# Patient Record
Sex: Female | Born: 1945 | Race: White | Hispanic: No | Marital: Married | Smoking: Former smoker
Health system: Southern US, Community
[De-identification: ages and names within clinical notes are randomized; demographics above are authoritative.]

## PROBLEM LIST (undated history)

## (undated) DIAGNOSIS — M84376A Stress fracture, unspecified foot, initial encounter for fracture: Secondary | ICD-10-CM

## (undated) DIAGNOSIS — F32A Depression, unspecified: Secondary | ICD-10-CM

## (undated) DIAGNOSIS — G4700 Insomnia, unspecified: Secondary | ICD-10-CM

## (undated) DIAGNOSIS — Z9889 Other specified postprocedural states: Secondary | ICD-10-CM

## (undated) DIAGNOSIS — J302 Other seasonal allergic rhinitis: Secondary | ICD-10-CM

## (undated) DIAGNOSIS — K219 Gastro-esophageal reflux disease without esophagitis: Secondary | ICD-10-CM

## (undated) DIAGNOSIS — R112 Nausea with vomiting, unspecified: Secondary | ICD-10-CM

## (undated) DIAGNOSIS — M8580 Other specified disorders of bone density and structure, unspecified site: Secondary | ICD-10-CM

## (undated) DIAGNOSIS — F329 Major depressive disorder, single episode, unspecified: Secondary | ICD-10-CM

## (undated) HISTORY — DX: Major depressive disorder, single episode, unspecified: F32.9

## (undated) HISTORY — PX: ABDOMINAL HYSTERECTOMY: SHX81

## (undated) HISTORY — PX: BACK SURGERY: SHX140

## (undated) HISTORY — PX: OOPHORECTOMY: SHX86

## (undated) HISTORY — DX: Depression, unspecified: F32.A

## (undated) HISTORY — DX: Other specified disorders of bone density and structure, unspecified site: M85.80

## (undated) HISTORY — PX: GANGLION CYST EXCISION: SHX1691

## (undated) HISTORY — DX: Insomnia, unspecified: G47.00

## (undated) HISTORY — DX: Stress fracture, unspecified foot, initial encounter for fracture: M84.376A

## (undated) HISTORY — PX: TONSILLECTOMY: SUR1361

## (undated) HISTORY — DX: Gastro-esophageal reflux disease without esophagitis: K21.9

---

## 1998-03-10 ENCOUNTER — Other Ambulatory Visit: Admission: RE | Admit: 1998-03-10 | Discharge: 1998-03-10 | Payer: Self-pay | Admitting: Obstetrics and Gynecology

## 1999-03-31 ENCOUNTER — Other Ambulatory Visit: Admission: RE | Admit: 1999-03-31 | Discharge: 1999-03-31 | Payer: Self-pay | Admitting: Obstetrics and Gynecology

## 2000-03-20 ENCOUNTER — Other Ambulatory Visit: Admission: RE | Admit: 2000-03-20 | Discharge: 2000-03-20 | Payer: Self-pay | Admitting: Obstetrics and Gynecology

## 2000-03-30 ENCOUNTER — Encounter: Payer: Self-pay | Admitting: Rheumatology

## 2000-03-30 ENCOUNTER — Encounter: Admission: RE | Admit: 2000-03-30 | Discharge: 2000-03-30 | Payer: Self-pay | Admitting: Rheumatology

## 2001-02-15 ENCOUNTER — Other Ambulatory Visit: Admission: RE | Admit: 2001-02-15 | Discharge: 2001-02-15 | Payer: Self-pay | Admitting: Obstetrics and Gynecology

## 2002-02-19 ENCOUNTER — Other Ambulatory Visit: Admission: RE | Admit: 2002-02-19 | Discharge: 2002-02-19 | Payer: Self-pay | Admitting: Obstetrics and Gynecology

## 2003-02-28 ENCOUNTER — Other Ambulatory Visit: Admission: RE | Admit: 2003-02-28 | Discharge: 2003-02-28 | Payer: Self-pay | Admitting: Obstetrics and Gynecology

## 2004-03-02 ENCOUNTER — Other Ambulatory Visit: Admission: RE | Admit: 2004-03-02 | Discharge: 2004-03-02 | Payer: Self-pay | Admitting: Obstetrics and Gynecology

## 2005-03-15 ENCOUNTER — Other Ambulatory Visit: Admission: RE | Admit: 2005-03-15 | Discharge: 2005-03-15 | Payer: Self-pay | Admitting: Obstetrics and Gynecology

## 2006-03-28 ENCOUNTER — Other Ambulatory Visit: Admission: RE | Admit: 2006-03-28 | Discharge: 2006-03-28 | Payer: Self-pay | Admitting: Obstetrics and Gynecology

## 2007-01-24 ENCOUNTER — Emergency Department (HOSPITAL_COMMUNITY): Admission: EM | Admit: 2007-01-24 | Discharge: 2007-01-24 | Payer: Self-pay | Admitting: Emergency Medicine

## 2007-04-03 ENCOUNTER — Other Ambulatory Visit: Admission: RE | Admit: 2007-04-03 | Discharge: 2007-04-03 | Payer: Self-pay | Admitting: Obstetrics and Gynecology

## 2007-11-09 ENCOUNTER — Encounter: Admission: RE | Admit: 2007-11-09 | Discharge: 2007-11-09 | Payer: Self-pay | Admitting: Sports Medicine

## 2007-11-28 ENCOUNTER — Encounter: Admission: RE | Admit: 2007-11-28 | Discharge: 2007-11-28 | Payer: Self-pay | Admitting: Sports Medicine

## 2008-04-25 ENCOUNTER — Ambulatory Visit: Payer: Self-pay | Admitting: Sports Medicine

## 2008-04-25 DIAGNOSIS — M5430 Sciatica, unspecified side: Secondary | ICD-10-CM | POA: Insufficient documentation

## 2008-05-13 ENCOUNTER — Ambulatory Visit: Payer: Self-pay | Admitting: Family Medicine

## 2008-05-14 ENCOUNTER — Telehealth (INDEPENDENT_AMBULATORY_CARE_PROVIDER_SITE_OTHER): Payer: Self-pay | Admitting: Family Medicine

## 2008-05-14 ENCOUNTER — Telehealth: Payer: Self-pay | Admitting: *Deleted

## 2008-05-19 ENCOUNTER — Encounter: Payer: Self-pay | Admitting: Family Medicine

## 2008-05-22 ENCOUNTER — Telehealth: Payer: Self-pay | Admitting: *Deleted

## 2008-05-26 ENCOUNTER — Encounter: Payer: Self-pay | Admitting: Family Medicine

## 2008-05-26 ENCOUNTER — Telehealth: Payer: Self-pay | Admitting: *Deleted

## 2008-05-27 ENCOUNTER — Telehealth: Payer: Self-pay | Admitting: *Deleted

## 2008-06-11 ENCOUNTER — Encounter: Admission: RE | Admit: 2008-06-11 | Discharge: 2008-06-11 | Payer: Self-pay | Admitting: Neurosurgery

## 2008-06-23 ENCOUNTER — Telehealth: Payer: Self-pay | Admitting: Family Medicine

## 2008-06-27 ENCOUNTER — Encounter: Admission: RE | Admit: 2008-06-27 | Discharge: 2008-06-27 | Payer: Self-pay | Admitting: Neurosurgery

## 2008-10-13 ENCOUNTER — Ambulatory Visit (HOSPITAL_COMMUNITY): Admission: RE | Admit: 2008-10-13 | Discharge: 2008-10-14 | Payer: Self-pay | Admitting: Neurosurgery

## 2010-01-07 ENCOUNTER — Ambulatory Visit (HOSPITAL_COMMUNITY): Admission: RE | Admit: 2010-01-07 | Discharge: 2010-01-07 | Payer: Self-pay | Admitting: Gastroenterology

## 2011-04-12 NOTE — Op Note (Signed)
NAMECAMIELLE, Sandra Hall                ACCOUNT NO.:  [id number]   MEDICAL RECORD NO.:  [id number]          PATIENT TYPE:  AMB   LOCATION:  SDS                          FACILITY:  [facility]   PHYSICIAN:  Donalee Citrin, M.D.        DATE OF BIRTH:  October 29, 1946   DATE OF PROCEDURE:  10/13/2008  DATE OF DISCHARGE:                               OPERATIVE REPORT   PREOPERATIVE DIAGNOSIS:  Left L4 radiculopathy from anterior disk bulge  with foraminal stenosis and extraforaminally on the left at L4-L5.   PROCEDURE:  Extraforaminal approach to an L4 foraminotomy with  microscopic dissection of the left L4 nerve root and microscopic  diskectomy.   SURGEON:  Donalee Citrin, MD   ASSISTANT:  Kathaleen Maser. Pool, MD   ANESTHESIA:  General endotracheal.   HISTORY OF PRESENT ILLNESS:  The patient is a very pleasant 65 year old  female who has had progressive worsening back, left buttock, and  posterior thigh pain that has been refractory to all forms of  conservative treatment with epidural steroid injections, physical  therapy, cortisone injections, and did respond to transforaminal  injections at L4.  Risks and benefits of the operation were explained to  the patient who understood and agreed to proceed forward.   PROCEDURE IN DETAIL:  The patient was brought to the OR, was induced  general anesthesia, positioned prone on the Wilson frame.  Back was  prepped and prepped in the usual sterile fashion.  The fascia was  localized at the appropriate level.  A midline incision was made with  Bovie electrocautery which was used to take down the incision.  Subperiosteal dissection was carried out at the lamina of L4.  TP of L4  was exposed.  Intraoperative x-ray confirmed the position at the  appropriate level in the inferior aspect of facet complex at L3-L4  lateral pars.  The superior aspect of facet complex at L4-L5 was drilled  down.  Then the undersurface of the lateral pars was underbitten with a  2-mm Kerrison  punch.  The operating microscope was draped and brought  into field.  Under microscopic illumination, the anterior transverse  ligament was identified and removed in piecemeal fashion exposing the L4  nerve root.  Then the L4 nerve root was teased off the bulging disk with  stenosis in the inferior aspect of L4 root.  This was incised.  Annulotomy was made.  Pituitary rongeurs were used to clean off the  bulging disk.  A large osteophyte was bitten off the inferior aspect of  the L4 vertebral body decompressing the L4 foramen.  At the end  diskectomy, there was no further stenosis in the extraforaminal space.  The coronary dilator and hockey stick were easily passed along the L4  foramen.  There was no further disk.  The coronary dilator was also used  to pass centrally and inferomedially.  After adequate decompression had  been achieved, the wound was copiously irrigated.  Meticulous hemostasis  was maintained.  Gelfoam was overlaid atop the L4 nerve root.  The wound  was  closed in layers  with interrupted Vicryl and running 4-0 subcuticular.  Benzoin and Steri-Strips were applied.  The patient was taken to the  recovery room in stable condition.  At the end of the case, needle,  sponge, and instrument count was correct.           ______________________________  Donalee Citrin, M.D.     GC/MEDQ  D:  10/13/2008  T:  10/13/2008  Job:  [id number]

## 2011-04-15 NOTE — Consult Note (Signed)
NAMEYARED, Sandra NO.:  [id number]   MEDICAL RECORD NO.:  [id number]          PATIENT TYPE:  EMS   LOCATION:  ED                           FACILITY:  [facility]   PHYSICIAN:  Lyn Records, M.D.   DATE OF BIRTH:  [birth year]   DATE OF CONSULTATION:  01/24/2007  DATE OF DISCHARGE:                                 CONSULTATION   REASON FOR CONSULTATION:  Syncope.   This consultation is performed in the emergency room at [facility].   CONCLUSION:  1. Vasovagal syncope with head injury.  2. Recurrent nausea and vomiting and probable volume contraction      starting 24 hours ago.  3. Hypokalemia secondary to #2.  4. Diffuse T wave flattening secondary to hypokalemia.      a.     Normal Cardiolite study approximately 2 years ago.      b.     Normal cardiac markers.   RECOMMENDATIONS:  1. IV fluid repletion.  2. Replete potassium.  3. Treat nausea symptomatically.  4. Consider observation for head injury in this patient who was      seemingly knocked unconscious when she had syncope.  5. I do not feel any specific cardiac evaluation is indicated under      these circumstances.   COMMENTS:  The patient is a very pleasant 65 year old with no prior  history of heart disease. She had a Cardiolite study within the last 2  years that was negative for evidence of ischemia. This study was  performed by Dr. Armanda Magic.   The patient has had no cardiopulmonary complaints. Starting yesterday,  she developed nausea with vomiting. This occurred repeatedly until last  evening when she called Dr. Katina Degree office and spoke with Dr. Earl Gala  who was on call. He ordered Phenergan. She took 1 Phenergan last night,  tried to sleep but was unable to sleep. This morning she got up and went  into the kitchen to try to see if she could eat something and again  immediately began having nausea, became sweaty and the next thing she  knew she awakened on the floor with  injury to her head with bleeding and  also injury to her neck. She was brought to the emergency room where she  was evaluated by Dr. Oletta Lamas. An EKG was performed that showed diffuse T  wave flattening and the concern about myocardial ischemia was raised. In  speaking with the patient, there has been absolutely no cardiopulmonary  complaints at all either preceding or following the event.   MEDICATIONS:  The patient's usual home medications are Ambien, Effexor  and Premarin.   ALLERGIES:  None known.   HABITS:  She does not smoke or drink alcohol to excess.   PHYSICAL EXAMINATION:  GENERAL:  The patient is in no acute distress.  VITAL SIGNS:  Her blood pressure is 124/72, heart rate is 86,  respiratory rate 16, orthostatic pressures are not measured.  HEENT:  She now has a neck brace on, she has staples in the occipital  region of her head.  She states her neck and shoulders are still sore.  The back of her head is sore.  CARDIAC:  Normal.  ABDOMEN:  Soft.  EXTREMITIES:  No edema.   EKG reveals nonspecific T wave flattening that is diffuse and likely  related to hypokalemia. No acute ST-T wave changes or definite evidence  of ischemia is noted. The patient's CK-MB is 466/9.0 with a normal  relative index of 1.9. Two troponin levels are normal at 0.01. The  patient's potassium is 3.4.   DISCUSSION:  I do not believe the patient has had any type of a cardiac  event. There is certainly no evidence of an acute coronary syndrome or  that this was a cardiogenic episode of syncope. She had prodrome, had  had preceding vomiting and it is likely that of the syncope was related  to vasovagal mechanism plus/minus the component of volume contraction.  No further cardiac evaluation seems necessary.      Lyn Records, M.D.  Electronically Signed     HWS/MEDQ  D:  01/24/2007  T:  01/24/2007  Job:  [id number]   cc:   Demetria Pore. Coral Spikes, M.D.  Fax: [phone]

## 2011-05-04 ENCOUNTER — Other Ambulatory Visit: Payer: Self-pay | Admitting: Obstetrics and Gynecology

## 2011-08-30 LAB — CBC
HCT: 41.2
MCHC: 33.7
RBC: 4.25
RDW: 12.6
WBC: 7.8

## 2013-01-10 ENCOUNTER — Ambulatory Visit: Payer: Self-pay | Admitting: Gynecology

## 2013-01-22 ENCOUNTER — Ambulatory Visit (INDEPENDENT_AMBULATORY_CARE_PROVIDER_SITE_OTHER): Payer: Medicare Other | Admitting: Gynecology

## 2013-01-22 ENCOUNTER — Encounter: Payer: Self-pay | Admitting: Gynecology

## 2013-01-22 VITALS — BP 112/66 | Ht 64.0 in | Wt 124.0 lb

## 2013-01-22 DIAGNOSIS — N649 Disorder of breast, unspecified: Secondary | ICD-10-CM

## 2013-01-22 DIAGNOSIS — R222 Localized swelling, mass and lump, trunk: Secondary | ICD-10-CM

## 2013-01-22 DIAGNOSIS — L988 Other specified disorders of the skin and subcutaneous tissue: Secondary | ICD-10-CM

## 2013-01-22 NOTE — Progress Notes (Signed)
Patient ID: Sandra Hall, female   DOB: 05-02-1946, 67 y.o.   MRN: [id number] Patient presents having not been seen in the practice for a number of years complaining of a one-month history of left chest mass.  Noticed on self-exam. Is not painful. Also points to a separate area on the skin that she noticed. No nipple discharge. Receives mammograms annually and states that she is coming due in June. She's also made an appointment to see me in June for her annual follow up exam.  Exam with Selena Batten assistant Neck supple without cervical adenopathy or supraclavicular adenopathy. Thyroid normal.  Both breast examined lying and sitting without masses retractions discharge adenopathy. The area she's pointing to is on her anterior left chest wall subclavicular, out of the breast tissue range and I feel no palpable abnormalities over the muscle/chest wall.  The other area she's pointing to on the left breast is a small cherry angioma benign in appearance.  Physical Exam  Pulmonary/Chest:      Assessment and plan: 1. Small cherry angioma left breast benign in appearance. Patient will continue to monitor. 2. Area of concern anterior upper chest wall. No visible or palpable abnormalities. I reassured the patient and at this point I asked her to observe the area. If it would change all her certainly enlarge she knows to let me know if his. Next up would be ultrasound over this area. She is planning mammogram in June and will follow up with me for reexamination.

## 2013-01-22 NOTE — Patient Instructions (Signed)
Follow up with me if there's any change in the area you are feeling. Otherwise follow up in June for annual checkup. Schedule your mammogram in June as planned

## 2013-05-15 ENCOUNTER — Ambulatory Visit (INDEPENDENT_AMBULATORY_CARE_PROVIDER_SITE_OTHER): Payer: Self-pay | Admitting: Gynecology

## 2013-05-15 ENCOUNTER — Encounter: Payer: Self-pay | Admitting: Gynecology

## 2013-05-15 VITALS — BP 120/76 | Ht 64.0 in | Wt 124.0 lb

## 2013-05-15 DIAGNOSIS — N951 Menopausal and female climacteric states: Secondary | ICD-10-CM

## 2013-05-15 DIAGNOSIS — N952 Postmenopausal atrophic vaginitis: Secondary | ICD-10-CM

## 2013-05-15 DIAGNOSIS — Z780 Asymptomatic menopausal state: Secondary | ICD-10-CM

## 2013-05-15 NOTE — Progress Notes (Signed)
Sandra Hall 06-Dec-1945 [id number]        67 y.o.  G1P0101 for followup exam.  Several issues noted below.  Past medical history,surgical history, medications, allergies, family history and social history were all reviewed and documented in the EPIC chart.  ROS:  Performed and pertinent positives and negatives are included in the history, assessment and plan .  Exam: Kim assistant Filed Vitals:   05/15/13 1059  BP: 120/76  Height: 5\' 4"  (1.626 m)  Weight: 124 lb (56.246 kg)   General appearance  Normal Skin grossly normal Head/Neck normal with no cervical or supraclavicular adenopathy thyroid normal Lungs  clear Cardiac RR, without RMG Abdominal  soft, nontender, without masses, organomegaly or hernia Breasts  examined lying and sitting without masses, retractions, discharge or axillary adenopathy. Pelvic  Ext/BUS/vagina  normal with atrophic changes  Adnexa  Without masses or tenderness    Anus and perineum  normal   Rectovaginal  normal sphincter tone without palpated masses or tenderness.    Assessment/Plan:  67 y.o. G97P0101 female for followup exam, status post abdominal hysterectomy/RSO.   1. Menopausal symptoms. Patient is having some hot flashes and sweats. Had previously been on HRT but stopped it. Options reviewed patient is comfortable just following now and does not want to try any medication. I did review possible OTC soy based products. 2. Atrophic vaginal changes. Asymptomatic. We'll continue to monitor. 3. Pap smear 2012. No Pap smear done today. No history of abnormal Pap smears previously. Reviewed current screening guidelines. She is status post hysterectomy for benign indications and age of 63. Stop screening reviewed versus less frequent screening interval. We'll readdress on an annual basis. 4. DEXA 5 years ago. Recommend repeat now. Patient will schedule. Increase calcium vitamin D reviewed.  5. Mammography 06/2012. Continue with annual mammography. SBE  monthly reviewed. 6. Colonoscopy 8 years ago. Repeat a 10 year interval per their recommendation. 7. Health maintenance. No lab work done as this is done through her primary physician's office. Followup for her DEXA otherwise one year.    Dara Lords MD, 11:34 AM 05/15/2013

## 2013-05-15 NOTE — Patient Instructions (Signed)
Schedule bone density. Followup for annual exam in one year

## 2013-05-30 ENCOUNTER — Encounter: Payer: Self-pay | Admitting: Gynecology

## 2013-05-30 ENCOUNTER — Ambulatory Visit (INDEPENDENT_AMBULATORY_CARE_PROVIDER_SITE_OTHER): Payer: Medicare Other

## 2013-05-30 DIAGNOSIS — M949 Disorder of cartilage, unspecified: Secondary | ICD-10-CM

## 2013-05-30 DIAGNOSIS — M899 Disorder of bone, unspecified: Secondary | ICD-10-CM

## 2013-05-30 DIAGNOSIS — N952 Postmenopausal atrophic vaginitis: Secondary | ICD-10-CM

## 2013-05-30 DIAGNOSIS — M8580 Other specified disorders of bone density and structure, unspecified site: Secondary | ICD-10-CM

## 2013-06-19 ENCOUNTER — Other Ambulatory Visit: Payer: Self-pay | Admitting: *Deleted

## 2013-06-19 DIAGNOSIS — M8580 Other specified disorders of bone density and structure, unspecified site: Secondary | ICD-10-CM

## 2013-06-24 ENCOUNTER — Other Ambulatory Visit: Payer: Medicare Other

## 2013-06-24 DIAGNOSIS — M8580 Other specified disorders of bone density and structure, unspecified site: Secondary | ICD-10-CM

## 2013-07-01 ENCOUNTER — Telehealth: Payer: Self-pay

## 2013-07-01 NOTE — Telephone Encounter (Signed)
Patient said she has not heard from her Bone Density or Vit D results.

## 2013-07-01 NOTE — Telephone Encounter (Signed)
Left message to call.

## 2013-07-01 NOTE — Telephone Encounter (Signed)
Vitamin D is normal but it also is available in my chart and I would remind her that she needs to look their first before calling for lab results. She should have received a copy of our DEXA report sheet. May check with Sherrilyn Rist to see why not if the patient says she never got it. Regardless it does show some osteopenia but not to the extent that I would recommend using medication like Fosamax. I would recommend weightbearing exercise, total calcium intake of 1200 mg daily and rechecking her bone density in 2 years.

## 2013-07-03 NOTE — Telephone Encounter (Signed)
Patient advised.  I sent her Activation code and website info for My Chart. I told her Sandra Hall will send her a Dexa Report Sheet in the mail and if she has questions to let me know.

## 2013-07-15 ENCOUNTER — Encounter: Payer: Self-pay | Admitting: Gynecology

## 2014-05-19 ENCOUNTER — Ambulatory Visit (INDEPENDENT_AMBULATORY_CARE_PROVIDER_SITE_OTHER): Payer: Commercial Managed Care - HMO | Admitting: Gynecology

## 2014-05-19 ENCOUNTER — Encounter: Payer: Self-pay | Admitting: Gynecology

## 2014-05-19 VITALS — BP 120/76 | Ht 64.0 in | Wt 124.0 lb

## 2014-05-19 DIAGNOSIS — N951 Menopausal and female climacteric states: Secondary | ICD-10-CM

## 2014-05-19 DIAGNOSIS — N952 Postmenopausal atrophic vaginitis: Secondary | ICD-10-CM

## 2014-05-19 NOTE — Patient Instructions (Signed)
Followup in one year for annual exam, sooner if any issues.  You may obtain a copy of any labs that were done today by logging onto MyChart as outlined in the instructions provided with your AVS (after visit summary). The office will not call with normal lab results but certainly if there are any significant abnormalities then we will contact you.   Health Maintenance, Female A healthy lifestyle and preventative care can promote health and wellness.  Maintain regular health, dental, and eye exams.  Eat a healthy diet. Foods like vegetables, fruits, whole grains, low-fat dairy products, and lean protein foods contain the nutrients you need without too many calories. Decrease your intake of foods high in solid fats, added sugars, and salt. Get information about a proper diet from your caregiver, if necessary.  Regular physical exercise is one of the most important things you can do for your health. Most adults should get at least 150 minutes of moderate-intensity exercise (any activity that increases your heart rate and causes you to sweat) each week. In addition, most adults need muscle-strengthening exercises on 2 or more days a week.   Maintain a healthy weight. The body mass index (BMI) is a screening tool to identify possible weight problems. It provides an estimate of body fat based on height and weight. Your caregiver can help determine your BMI, and can help you achieve or maintain a healthy weight. For adults 20 years and older:  A BMI below 18.5 is considered underweight.  A BMI of 18.5 to 24.9 is normal.  A BMI of 25 to 29.9 is considered overweight.  A BMI of 30 and above is considered obese.  Maintain normal blood lipids and cholesterol by exercising and minimizing your intake of saturated fat. Eat a balanced diet with plenty of fruits and vegetables. Blood tests for lipids and cholesterol should begin at age 20 and be repeated every 5 years. If your lipid or cholesterol levels are  high, you are over 50, or you are a high risk for heart disease, you may need your cholesterol levels checked more frequently.Ongoing high lipid and cholesterol levels should be treated with medicines if diet and exercise are not effective.  If you smoke, find out from your caregiver how to quit. If you do not use tobacco, do not start.  Lung cancer screening is recommended for adults aged 55 80 years who are at high risk for developing lung cancer because of a history of smoking. Yearly low-dose computed tomography (CT) is recommended for people who have at least a 30-pack-year history of smoking and are a current smoker or have quit within the past 15 years. A pack year of smoking is smoking an average of 1 pack of cigarettes a day for 1 year (for example: 1 pack a day for 30 years or 2 packs a day for 15 years). Yearly screening should continue until the smoker has stopped smoking for at least 15 years. Yearly screening should also be stopped for people who develop a health problem that would prevent them from having lung cancer treatment.  If you are pregnant, do not drink alcohol. If you are breastfeeding, be very cautious about drinking alcohol. If you are not pregnant and choose to drink alcohol, do not exceed 1 drink per day. One drink is considered to be 12 ounces (355 mL) of beer, 5 ounces (148 mL) of wine, or 1.5 ounces (44 mL) of liquor.  Avoid use of street drugs. Do not share needles with   anyone. Ask for help if you need support or instructions about stopping the use of drugs.  High blood pressure causes heart disease and increases the risk of stroke. Blood pressure should be checked at least every 1 to 2 years. Ongoing high blood pressure should be treated with medicines, if weight loss and exercise are not effective.  If you are 55 to 67 years old, ask your caregiver if you should take aspirin to prevent strokes.  Diabetes screening involves taking a blood sample to check your fasting  blood sugar level. This should be done once every 3 years, after age 45, if you are within normal weight and without risk factors for diabetes. Testing should be considered at a younger age or be carried out more frequently if you are overweight and have at least 1 risk factor for diabetes.  Breast cancer screening is essential preventative care for women. You should practice "breast self-awareness." This means understanding the normal appearance and feel of your breasts and may include breast self-examination. Any changes detected, no matter how small, should be reported to a caregiver. Women in their 20s and 30s should have a clinical breast exam (CBE) by a caregiver as part of a regular health exam every 1 to 3 years. After age 40, women should have a CBE every year. Starting at age 40, women should consider having a mammogram (breast X-ray) every year. Women who have a family history of breast cancer should talk to their caregiver about genetic screening. Women at a high risk of breast cancer should talk to their caregiver about having an MRI and a mammogram every year.  Breast cancer gene (BRCA)-related cancer risk assessment is recommended for women who have family members with BRCA-related cancers. BRCA-related cancers include breast, ovarian, tubal, and peritoneal cancers. Having family members with these cancers may be associated with an increased risk for harmful changes (mutations) in the breast cancer genes BRCA1 and BRCA2. Results of the assessment will determine the need for genetic counseling and BRCA1 and BRCA2 testing.  The Pap test is a screening test for cervical cancer. Women should have a Pap test starting at age 21. Between ages 21 and 29, Pap tests should be repeated every 2 years. Beginning at age 30, you should have a Pap test every 3 years as long as the past 3 Pap tests have been normal. If you had a hysterectomy for a problem that was not cancer or a condition that could lead to  cancer, then you no longer need Pap tests. If you are between ages 65 and 70, and you have had normal Pap tests going back 10 years, you no longer need Pap tests. If you have had past treatment for cervical cancer or a condition that could lead to cancer, you need Pap tests and screening for cancer for at least 20 years after your treatment. If Pap tests have been discontinued, risk factors (such as a new sexual partner) need to be reassessed to determine if screening should be resumed. Some women have medical problems that increase the chance of getting cervical cancer. In these cases, your caregiver may recommend more frequent screening and Pap tests.  The human papillomavirus (HPV) test is an additional test that may be used for cervical cancer screening. The HPV test looks for the virus that can cause the cell changes on the cervix. The cells collected during the Pap test can be tested for HPV. The HPV test could be used to screen women aged 30   years and older, and should be used in women of any age who have unclear Pap test results. After the age of 30, women should have HPV testing at the same frequency as a Pap test.  Colorectal cancer can be detected and often prevented. Most routine colorectal cancer screening begins at the age of 50 and continues through age 75. However, your caregiver may recommend screening at an earlier age if you have risk factors for colon cancer. On a yearly basis, your caregiver may provide home test kits to check for hidden blood in the stool. Use of a small camera at the end of a tube, to directly examine the colon (sigmoidoscopy or colonoscopy), can detect the earliest forms of colorectal cancer. Talk to your caregiver about this at age 50, when routine screening begins. Direct examination of the colon should be repeated every 5 to 10 years through age 75, unless early forms of pre-cancerous polyps or small growths are found.  Hepatitis C blood testing is recommended for  all people born from 1945 through 1965 and any individual with known risks for hepatitis C.  Practice safe sex. Use condoms and avoid high-risk sexual practices to reduce the spread of sexually transmitted infections (STIs). Sexually active women aged 25 and younger should be checked for Chlamydia, which is a common sexually transmitted infection. Older women with new or multiple partners should also be tested for Chlamydia. Testing for other STIs is recommended if you are sexually active and at increased risk.  Osteoporosis is a disease in which the bones lose minerals and strength with aging. This can result in serious bone fractures. The risk of osteoporosis can be identified using a bone density scan. Women ages 65 and over and women at risk for fractures or osteoporosis should discuss screening with their caregivers. Ask your caregiver whether you should be taking a calcium supplement or vitamin D to reduce the rate of osteoporosis.  Menopause can be associated with physical symptoms and risks. Hormone replacement therapy is available to decrease symptoms and risks. You should talk to your caregiver about whether hormone replacement therapy is right for you.  Use sunscreen. Apply sunscreen liberally and repeatedly throughout the day. You should seek shade when your shadow is shorter than you. Protect yourself by wearing long sleeves, pants, a wide-brimmed hat, and sunglasses year round, whenever you are outdoors.  Notify your caregiver of new moles or changes in moles, especially if there is a change in shape or color. Also notify your caregiver if a mole is larger than the size of a pencil eraser.  Stay current with your immunizations. Document Released: 05/30/2011 Document Revised: 03/11/2013 Document Reviewed: 05/30/2011 [employer] Patient Information 2014 [employer].   

## 2014-05-19 NOTE — Progress Notes (Signed)
Sandra Hall 08/10/1946 [id number]        68 y.o.  G1P0101 for annual exam. Several issues noted below. Past medical history,surgical history, problem list, medications, allergies, family history and social history were all reviewed and documented as reviewed in the EPIC chart.  ROS:  12 system ROS performed with pertinent positives and negatives included in the history, assessment and plan.  Included Systems: General, HEENT, Neck, Cardiovascular, Pulmonary, Gastrointestinal, Genitourinary, Musculoskeletal, Dermatologic, Endocrine, Hematological, Neurologic, Psychiatric Additional significant findings :  None   Exam: Kim assistant Filed Vitals:   05/19/14 1053  BP: 120/76  Height: 5\' 4"  (1.626 m)  Weight: 124 lb (56.246 kg)   General appearance:  Normal affect, orientation and appearance. Skin: Grossly normal HEENT: Without gross lesions.  No cervical or supraclavicular adenopathy. Thyroid normal.  Lungs:  Clear without wheezing, rales or rhonchi Cardiac: RR, without RMG Abdominal:  Soft, nontender, without masses, guarding, rebound, organomegaly or hernia Breasts:  Examined lying and sitting without masses, retractions, discharge or axillary adenopathy. Pelvic:  Ext/BUS/vagina with generalized atrophic changes   Adnexa  Without masses or tenderness    Anus and perineum  Normal   Rectovaginal  Normal sphincter tone without palpated masses or tenderness.    Assessment/Plan:  68 y.o. G60P0101 female for followup exam.   1. Postmenopausal/atrophic genital changes. Patient continues to have hot flushes and sweats. Had been on ERT in the past but discontinued. Is not interested in prescription medication at this time. I again reviewed OTC products include soy-based. Patient is going to try this. Currently is not sexually active. Is not having issues with vaginal dryness. Will followup if this becomes an issue. 2. Osteopenia. DEXA 05/2013 T score -1.9. FRAX 17%/1.3%. Vitamin D level last  year 58. Increased calcium/vitamin D reviewed. Plan repeat DEXA next year at two-year interval. 3. Pap smear 2012. No Pap smear done today. Over the age of 65. No history of abnormal Pap smears previously. Status post hysterectomy for benign indications. Per current screening guidelines we both agree to stop screening and she is comfortable with this. 4. Colonoscopy 2006. Plan repeat next year and she will arrange this. 5. Mammography 06/2013. Continue with annual mammography. SBE monthly reviewed. 6. Health maintenance. No routine blood work done as this is done through her primary physician's office. Followup one year, sooner as needed.   Note: This document was prepared with digital dictation and possible smart phrase technology. Any transcriptional errors that result from this process are unintentional.   Anastasio Auerbach MD, 11:38 AM 05/19/2014

## 2014-07-21 ENCOUNTER — Encounter: Payer: Self-pay | Admitting: Gynecology

## 2014-09-29 ENCOUNTER — Encounter: Payer: Self-pay | Admitting: Gynecology

## 2014-12-05 DIAGNOSIS — M84374D Stress fracture, right foot, subsequent encounter for fracture with routine healing: Secondary | ICD-10-CM | POA: Diagnosis not present

## 2014-12-05 DIAGNOSIS — M722 Plantar fascial fibromatosis: Secondary | ICD-10-CM | POA: Diagnosis not present

## 2014-12-31 DIAGNOSIS — M84374D Stress fracture, right foot, subsequent encounter for fracture with routine healing: Secondary | ICD-10-CM | POA: Diagnosis not present

## 2015-02-02 DIAGNOSIS — M8580 Other specified disorders of bone density and structure, unspecified site: Secondary | ICD-10-CM | POA: Insufficient documentation

## 2015-02-02 DIAGNOSIS — Z87891 Personal history of nicotine dependence: Secondary | ICD-10-CM | POA: Diagnosis not present

## 2015-02-02 DIAGNOSIS — R21 Rash and other nonspecific skin eruption: Secondary | ICD-10-CM | POA: Diagnosis present

## 2015-02-02 DIAGNOSIS — K219 Gastro-esophageal reflux disease without esophagitis: Secondary | ICD-10-CM | POA: Insufficient documentation

## 2015-02-02 DIAGNOSIS — G4700 Insomnia, unspecified: Secondary | ICD-10-CM | POA: Insufficient documentation

## 2015-02-02 DIAGNOSIS — L253 Unspecified contact dermatitis due to other chemical products: Secondary | ICD-10-CM | POA: Insufficient documentation

## 2015-02-03 ENCOUNTER — Emergency Department (HOSPITAL_BASED_OUTPATIENT_CLINIC_OR_DEPARTMENT_OTHER)
Admission: EM | Admit: 2015-02-03 | Discharge: 2015-02-03 | Disposition: A | Discharge status: Home / Self Care | Payer: Commercial Managed Care - HMO | Attending: Emergency Medicine | Admitting: Emergency Medicine

## 2015-02-03 ENCOUNTER — Encounter (HOSPITAL_BASED_OUTPATIENT_CLINIC_OR_DEPARTMENT_OTHER): Payer: Self-pay | Admitting: Emergency Medicine

## 2015-02-03 DIAGNOSIS — G4700 Insomnia, unspecified: Secondary | ICD-10-CM | POA: Diagnosis not present

## 2015-02-03 DIAGNOSIS — L253 Unspecified contact dermatitis due to other chemical products: Secondary | ICD-10-CM | POA: Diagnosis not present

## 2015-02-03 DIAGNOSIS — K219 Gastro-esophageal reflux disease without esophagitis: Secondary | ICD-10-CM | POA: Diagnosis not present

## 2015-02-03 DIAGNOSIS — M8580 Other specified disorders of bone density and structure, unspecified site: Secondary | ICD-10-CM | POA: Diagnosis not present

## 2015-02-03 DIAGNOSIS — Z87891 Personal history of nicotine dependence: Secondary | ICD-10-CM | POA: Diagnosis not present

## 2015-02-03 MED ORDER — DEXAMETHASONE SODIUM PHOSPHATE 10 MG/ML IJ SOLN
10.0000 mg | Freq: Once | INTRAMUSCULAR | Status: AC
  Administered 2015-02-03: 10 mg via INTRAMUSCULAR
  Filled 2015-02-03: qty 1

## 2015-02-03 MED ORDER — HYDROXYZINE HCL 25 MG PO TABS: 25.0000 mg | ORAL_TABLET | Freq: Four times a day (QID) | ORAL | Status: DC | PRN

## 2015-02-03 NOTE — ED Provider Notes (Signed)
CSN: [id number]     Arrival date & time 02/02/15  2358 History   First MD Initiated Contact with Patient 02/03/15 [phone]     Chief Complaint  Patient presents with  . Allergic Reaction     (Consider location/radiation/quality/duration/timing/severity/associated sxs/prior Treatment) HPI  This is a 69 year old female who applied a new brand of sunscreen to her face neck trunk and upper arms the morning of the day before yesterday. Yesterday morning she developed a rash on the areas to which she applied the sunscreen. The rash is erythematous, pruritic and confluent on the face and neck. She had a mild sensation of tongue swelling. She has had no shortness of breath. She has had no nausea, vomiting or diarrhea. She took 25 milligrams of Benadryl yesterday morning and another 25 milligrams yesterday evening with minimal relief. She gave away the bottle of sunscreen so is unaware what ingredients were in it.  Past Medical History  Diagnosis Date  . Acid reflux   . Insomnia   . Osteopenia 05/2013    T score -1.9 FRAX 17%/1.3%   Past Surgical History  Procedure Laterality Date  . Back surgery    . Oophorectomy      RSO  . Ganglion cyst excision    . Tonsillectomy    . Abdominal hysterectomy      Hyst and RSO for endometriosis   Family History  Problem Relation Age of Onset  . Diabetes Mother   . Cancer Father     Mesothelioma  . Diabetes Sister   . Ovarian cancer Maternal Aunt   . Cancer Paternal Uncle     Lung  . Diabetes Maternal Grandmother    History  Substance Use Topics  . Smoking status: Former Research scientist (life sciences)  . Smokeless tobacco: Not on file  . Alcohol Use: 0.5 oz/week    1 drink(s) per week   OB History    Gravida Para Term Preterm AB TAB SAB Ectopic Multiple Living   1 1  1      1      Review of Systems  All other systems reviewed and are negative.   Allergies  Codeine  Home Medications   Prior to Admission medications   Medication Sig Start Date End Date Taking?  Authorizing Provider  Calcium Carbonate-Vitamin D (CALCIUM + D PO) Take by mouth.    Historical Provider, MD  Cholecalciferol (VITAMIN D PO) Take by mouth.    Historical Provider, MD  esomeprazole (NEXIUM) 40 MG capsule Take 40 mg by mouth daily before breakfast.    Historical Provider, MD  Eszopiclone 3 MG TABS Take 3 mg by mouth at bedtime. Take immediately before bedtime    Historical Provider, MD  Fexofenadine HCl (ALLEGRA PO) Take by mouth.    Historical Provider, MD  Multiple Vitamin (MULTIVITAMIN) tablet Take 1 tablet by mouth daily.    Historical Provider, MD   BP 101/72 mmHg  Pulse 80  Temp(Src) 98.6 F (37 C) (Oral)  Resp 18  Ht 5\' 4"  (1.626 m)  Wt 124 lb (56.246 kg)  BMI 21.27 kg/m2  SpO2 100%   Physical Exam  General: Well-developed, well-nourished female in no acute distress; appearance consistent with age of record HENT: normocephalic; atraumatic; pharynx normal; no edema; no dysphonia Eyes: pupils equal, round and reactive to light; extraocular muscles intact Neck: supple Heart: regular rate and rhythm Lungs: clear to auscultation bilaterally Abdomen: soft; nondistended; nontender; bowel sounds present Extremities: No deformity; full range of motion; pulses normal Neurologic: Awake,  alert and oriented; motor function intact in all extremities and symmetric; no facial droop Skin: Warm and dry; fine maculopapular rash of upper arms, trunk, neck and face. Rash becomes confluent on the neck and face Psychiatric: Normal mood and affect    ED Course  Procedures (including critical care time)   MDM    Shanon Rosser, MD 02/03/15 [phone]

## 2015-02-03 NOTE — Discharge Instructions (Signed)

## 2015-02-03 NOTE — ED Notes (Signed)
Pt states she took one benadryl this morning and one this evening around 6pm.

## 2015-02-03 NOTE — ED Notes (Signed)
Pt states that she started having an allergic reaction to sun screen this morning.

## 2015-02-05 DIAGNOSIS — L259 Unspecified contact dermatitis, unspecified cause: Secondary | ICD-10-CM | POA: Diagnosis not present

## 2015-02-12 DIAGNOSIS — L239 Allergic contact dermatitis, unspecified cause: Secondary | ICD-10-CM | POA: Diagnosis not present

## 2015-02-12 DIAGNOSIS — L578 Other skin changes due to chronic exposure to nonionizing radiation: Secondary | ICD-10-CM | POA: Diagnosis not present

## 2015-02-12 DIAGNOSIS — L820 Inflamed seborrheic keratosis: Secondary | ICD-10-CM | POA: Diagnosis not present

## 2015-02-12 DIAGNOSIS — L819 Disorder of pigmentation, unspecified: Secondary | ICD-10-CM | POA: Diagnosis not present

## 2015-02-12 DIAGNOSIS — L814 Other melanin hyperpigmentation: Secondary | ICD-10-CM | POA: Diagnosis not present

## 2015-04-01 DIAGNOSIS — H40013 Open angle with borderline findings, low risk, bilateral: Secondary | ICD-10-CM | POA: Diagnosis not present

## 2015-05-06 DIAGNOSIS — G4700 Insomnia, unspecified: Secondary | ICD-10-CM | POA: Diagnosis not present

## 2015-05-06 DIAGNOSIS — Z0000 Encounter for general adult medical examination without abnormal findings: Secondary | ICD-10-CM | POA: Diagnosis not present

## 2015-05-06 DIAGNOSIS — Z1211 Encounter for screening for malignant neoplasm of colon: Secondary | ICD-10-CM | POA: Diagnosis not present

## 2015-05-06 DIAGNOSIS — M8588 Other specified disorders of bone density and structure, other site: Secondary | ICD-10-CM | POA: Diagnosis not present

## 2015-05-06 DIAGNOSIS — J309 Allergic rhinitis, unspecified: Secondary | ICD-10-CM | POA: Diagnosis not present

## 2015-05-06 DIAGNOSIS — E780 Pure hypercholesterolemia: Secondary | ICD-10-CM | POA: Diagnosis not present

## 2015-05-06 DIAGNOSIS — K219 Gastro-esophageal reflux disease without esophagitis: Secondary | ICD-10-CM | POA: Diagnosis not present

## 2015-05-06 DIAGNOSIS — R7309 Other abnormal glucose: Secondary | ICD-10-CM | POA: Diagnosis not present

## 2015-05-27 ENCOUNTER — Encounter: Payer: Self-pay | Admitting: Gynecology

## 2015-05-27 ENCOUNTER — Ambulatory Visit (INDEPENDENT_AMBULATORY_CARE_PROVIDER_SITE_OTHER): Payer: Commercial Managed Care - HMO | Admitting: Gynecology

## 2015-05-27 VITALS — BP 120/76 | Ht 64.0 in | Wt 125.0 lb

## 2015-05-27 DIAGNOSIS — N952 Postmenopausal atrophic vaginitis: Secondary | ICD-10-CM

## 2015-05-27 DIAGNOSIS — M8580 Other specified disorders of bone density and structure, unspecified site: Secondary | ICD-10-CM | POA: Diagnosis not present

## 2015-05-27 DIAGNOSIS — Z01419 Encounter for gynecological examination (general) (routine) without abnormal findings: Secondary | ICD-10-CM

## 2015-05-27 NOTE — Progress Notes (Signed)
Sandra Hall [birth year] [id number]        69 y.o.  G1P0101 for breast and pelvic exam.  Several issues noted below.  Past medical history,surgical history, problem list, medications, allergies, family history and social history were all reviewed and documented as reviewed in the EPIC chart.  ROS:  Performed with pertinent positives and negatives included in the history, assessment and plan.   Additional significant findings :  none   Exam: Kim Counsellor Vitals:   05/27/15 1047  BP: 120/76  Height: 5\' 4"  (1.626 m)  Weight: 125 lb (56.7 kg)   General appearance:  Normal affect, orientation and appearance. Skin: Grossly normal HEENT: Without gross lesions.  No cervical or supraclavicular adenopathy. Thyroid normal.  Lungs:  Clear without wheezing, rales or rhonchi Cardiac: RR, without RMG Abdominal:  Soft, nontender, without masses, guarding, rebound, organomegaly or hernia Breasts:  Examined lying and sitting without masses, retractions, discharge or axillary adenopathy. Pelvic:  Ext/BUS/vagina with atrophic changes  Adnexa  Without masses or tenderness    Anus and perineum  Normal   Rectovaginal  Normal sphincter tone without palpated masses or tenderness.    Assessment/Plan:  69 y.o. G9P0101 female for breast and pelvic exam.   1. Post menopausal/atrophic genital changes. Status post TAH RSO for endometriosis.  Patient started Cherene Julian last year and has done well as far as decreased hot flashes and sweats. Not having issue with vaginal dryness.  Continue to monitor and report any issues. 2. Osteopenia. DEXA 05/2013 T score -1.9 FRAX 17%/1.3%. Repeat DEXA now a 2 year interval. Increased calcium vitamin D reviewed. 3. Pap smear 2012. No Pap smear done today. No history of abnormal Pap smears previously. Discussed current screening guidelines. She is over the age of 75 and status post hysterectomy for benign indications. We both agree to stop screening. 4. Mammography 06/2014.  Continue with annual mammography. SBE monthly reviewed. 5. Colonoscopy 2006. Schedule repeat colonoscopy this year. 6. Health maintenance. No routine blood work done as this is done at her primary physician's office. Follow up 1 year, sooner as needed.   Anastasio Auerbach MD, 11:10 AM 05/27/2015

## 2015-05-27 NOTE — Patient Instructions (Addendum)
Schedule your colonoscopy as it has been 10 years since your last one..  You may obtain a copy of any labs that were done today by logging onto MyChart as outlined in the instructions provided with your AVS (after visit summary). The office will not call with normal lab results but certainly if there are any significant abnormalities then we will contact you.   Health Maintenance, Female A healthy lifestyle and preventative care can promote health and wellness.  Maintain regular health, dental, and eye exams.  Eat a healthy diet. Foods like vegetables, fruits, whole grains, low-fat dairy products, and lean protein foods contain the nutrients you need without too many calories. Decrease your intake of foods high in solid fats, added sugars, and salt. Get information about a proper diet from your caregiver, if necessary.  Regular physical exercise is one of the most important things you can do for your health. Most adults should get at least 150 minutes of moderate-intensity exercise (any activity that increases your heart rate and causes you to sweat) each week. In addition, most adults need muscle-strengthening exercises on 2 or more days a week.   Maintain a healthy weight. The body mass index (BMI) is a screening tool to identify possible weight problems. It provides an estimate of body fat based on height and weight. Your caregiver can help determine your BMI, and can help you achieve or maintain a healthy weight. For adults 20 years and older:  A BMI below 18.5 is considered underweight.  A BMI of 18.5 to 24.9 is normal.  A BMI of 25 to 29.9 is considered overweight.  A BMI of 30 and above is considered obese.  Maintain normal blood lipids and cholesterol by exercising and minimizing your intake of saturated fat. Eat a balanced diet with plenty of fruits and vegetables. Blood tests for lipids and cholesterol should begin at age 67 and be repeated every 5 years. If your lipid or  cholesterol levels are high, you are over 50, or you are a high risk for heart disease, you may need your cholesterol levels checked more frequently.Ongoing high lipid and cholesterol levels should be treated with medicines if diet and exercise are not effective.  If you smoke, find out from your caregiver how to quit. If you do not use tobacco, do not start.  Lung cancer screening is recommended for adults aged 85 80 years who are at high risk for developing lung cancer because of a history of smoking. Yearly low-dose computed tomography (CT) is recommended for people who have at least a 30-pack-year history of smoking and are a current smoker or have quit within the past 15 years. A pack year of smoking is smoking an average of 1 pack of cigarettes a day for 1 year (for example: 1 pack a day for 30 years or 2 packs a day for 15 years). Yearly screening should continue until the smoker has stopped smoking for at least 15 years. Yearly screening should also be stopped for people who develop a health problem that would prevent them from having lung cancer treatment.  If you are pregnant, do not drink alcohol. If you are breastfeeding, be very cautious about drinking alcohol. If you are not pregnant and choose to drink alcohol, do not exceed 1 drink per day. One drink is considered to be 12 ounces (355 mL) of beer, 5 ounces (148 mL) of wine, or 1.5 ounces (44 mL) of liquor.  Avoid use of street drugs. Do not share  with anyone. Ask for help if you need support or instructions about stopping the use of drugs.  High blood pressure causes heart disease and increases the risk of stroke. Blood pressure should be checked at least every 1 to 2 years. Ongoing high blood pressure should be treated with medicines, if weight loss and exercise are not effective.  If you are 55 to 68 years old, ask your caregiver if you should take aspirin to prevent strokes.  Diabetes screening involves taking a blood sample  to check your fasting blood sugar level. This should be done once every 3 years, after age 45, if you are within normal weight and without risk factors for diabetes. Testing should be considered at a younger age or be carried out more frequently if you are overweight and have at least 1 risk factor for diabetes.  Breast cancer screening is essential preventative care for women. You should practice "breast self-awareness." This means understanding the normal appearance and feel of your breasts and may include breast self-examination. Any changes detected, no matter how small, should be reported to a caregiver. Women in their 20s and 30s should have a clinical breast exam (CBE) by a caregiver as part of a regular health exam every 1 to 3 years. After age 40, women should have a CBE every year. Starting at age 40, women should consider having a mammogram (breast X-ray) every year. Women who have a family history of breast cancer should talk to their caregiver about genetic screening. Women at a high risk of breast cancer should talk to their caregiver about having an MRI and a mammogram every year.  Breast cancer gene (BRCA)-related cancer risk assessment is recommended for women who have family members with BRCA-related cancers. BRCA-related cancers include breast, ovarian, tubal, and peritoneal cancers. Having family members with these cancers may be associated with an increased risk for harmful changes (mutations) in the breast cancer genes BRCA1 and BRCA2. Results of the assessment will determine the need for genetic counseling and BRCA1 and BRCA2 testing.  The Pap test is a screening test for cervical cancer. Women should have a Pap test starting at age 21. Between ages 21 and 29, Pap tests should be repeated every 2 years. Beginning at age 30, you should have a Pap test every 3 years as long as the past 3 Pap tests have been normal. If you had a hysterectomy for a problem that was not cancer or a condition  that could lead to cancer, then you no longer need Pap tests. If you are between ages 65 and 70, and you have had normal Pap tests going back 10 years, you no longer need Pap tests. If you have had past treatment for cervical cancer or a condition that could lead to cancer, you need Pap tests and screening for cancer for at least 20 years after your treatment. If Pap tests have been discontinued, risk factors (such as a new sexual partner) need to be reassessed to determine if screening should be resumed. Some women have medical problems that increase the chance of getting cervical cancer. In these cases, your caregiver may recommend more frequent screening and Pap tests.  The human papillomavirus (HPV) test is an additional test that may be used for cervical cancer screening. The HPV test looks for the virus that can cause the cell changes on the cervix. The cells collected during the Pap test can be tested for HPV. The HPV test could be used to screen women aged   aged 55 years and older, and should be used in women of any age who have unclear Pap test results. After the age of 76, women should have HPV testing at the same frequency as a Pap test.  Colorectal cancer can be detected and often prevented. Most routine colorectal cancer screening begins at the age of 64 and continues through age 83. However, your caregiver may recommend screening at an earlier age if you have risk factors for colon cancer. On a yearly basis, your caregiver may provide home test kits to check for hidden blood in the stool. Use of a small camera at the end of a tube, to directly examine the colon (sigmoidoscopy or colonoscopy), can detect the earliest forms of colorectal cancer. Talk to your caregiver about this at age 49, when routine screening begins. Direct examination of the colon should be repeated every 5 to 10 years through age 53, unless early forms of pre-cancerous polyps or small growths are found.  Hepatitis C blood testing is  recommended for all people born from [birth year] through 1964 and any individual with known risks for hepatitis C.  Practice safe sex. Use condoms and avoid high-risk sexual practices to reduce the spread of sexually transmitted infections (STIs). Sexually active women aged 64 and younger should be checked for Chlamydia, which is a common sexually transmitted infection. Older women with new or multiple partners should also be tested for Chlamydia. Testing for other STIs is recommended if you are sexually active and at increased risk.  Osteoporosis is a disease in which the bones lose minerals and strength with aging. This can result in serious bone fractures. The risk of osteoporosis can be identified using a bone density scan. Women ages 15 and over and women at risk for fractures or osteoporosis should discuss screening with their caregivers. Ask your caregiver whether you should be taking a calcium supplement or vitamin D to reduce the rate of osteoporosis.  Menopause can be associated with physical symptoms and risks. Hormone replacement therapy is available to decrease symptoms and risks. You should talk to your caregiver about whether hormone replacement therapy is right for you.  Use sunscreen. Apply sunscreen liberally and repeatedly throughout the day. You should seek shade when your shadow is shorter than you. Protect yourself by wearing long sleeves, pants, a wide-brimmed hat, and sunglasses year round, whenever you are outdoors.  Notify your caregiver of new moles or changes in moles, especially if there is a change in shape or color. Also notify your caregiver if a mole is larger than the size of a pencil eraser.  Stay current with your immunizations. Document Released: 05/30/2011 Document Revised: 03/11/2013 Document Reviewed: 05/30/2011 [facility] Patient Information 2014 [place].

## 2015-05-28 LAB — URINALYSIS W MICROSCOPIC + REFLEX CULTURE
BACTERIA UA: NONE SEEN
BILIRUBIN URINE: NEGATIVE
CRYSTALS: NONE SEEN
Casts: NONE SEEN
GLUCOSE, UA: NEGATIVE mg/dL
Hgb urine dipstick: NEGATIVE
Ketones, ur: NEGATIVE mg/dL
LEUKOCYTES UA: NEGATIVE
NITRITE: NEGATIVE
PROTEIN: NEGATIVE mg/dL
Specific Gravity, Urine: 1.012 (ref 1.005–1.030)
Squamous Epithelial / LPF: NONE SEEN
Urobilinogen, UA: 0.2 mg/dL (ref 0.0–1.0)
pH: 5 (ref 5.0–8.0)

## 2015-05-29 DIAGNOSIS — M8580 Other specified disorders of bone density and structure, unspecified site: Secondary | ICD-10-CM

## 2015-05-29 HISTORY — DX: Other specified disorders of bone density and structure, unspecified site: M85.80

## 2015-06-02 ENCOUNTER — Other Ambulatory Visit: Payer: Self-pay | Admitting: Gastroenterology

## 2015-06-15 DIAGNOSIS — M545 Low back pain: Secondary | ICD-10-CM | POA: Diagnosis not present

## 2015-06-25 ENCOUNTER — Other Ambulatory Visit: Payer: Self-pay | Admitting: Gynecology

## 2015-06-25 ENCOUNTER — Ambulatory Visit (INDEPENDENT_AMBULATORY_CARE_PROVIDER_SITE_OTHER): Payer: Commercial Managed Care - HMO

## 2015-06-25 DIAGNOSIS — M8580 Other specified disorders of bone density and structure, unspecified site: Secondary | ICD-10-CM

## 2015-06-25 DIAGNOSIS — Z13820 Encounter for screening for osteoporosis: Secondary | ICD-10-CM

## 2015-06-26 ENCOUNTER — Encounter: Payer: Self-pay | Admitting: Gynecology

## 2015-06-26 ENCOUNTER — Other Ambulatory Visit: Payer: Commercial Managed Care - HMO

## 2015-06-26 ENCOUNTER — Telehealth: Payer: Self-pay | Admitting: Gynecology

## 2015-06-26 DIAGNOSIS — M8580 Other specified disorders of bone density and structure, unspecified site: Secondary | ICD-10-CM

## 2015-06-26 NOTE — Telephone Encounter (Signed)
Left message for pt to call.

## 2015-06-26 NOTE — Telephone Encounter (Signed)
Tell patient that her recent bone density does show an increased fracture risk and I would like to talk to her about treatment options. Recommend office visits to do so. Also recommend baseline vitamin D level now.

## 2015-06-26 NOTE — Telephone Encounter (Signed)
Pt informed with the below note, order placed, pt will come today at 3:30pm

## 2015-06-27 LAB — VITAMIN D 25 HYDROXY (VIT D DEFICIENCY, FRACTURES): Vit D, 25-Hydroxy: 81 ng/mL (ref 30–100)

## 2015-07-20 DIAGNOSIS — Z1231 Encounter for screening mammogram for malignant neoplasm of breast: Secondary | ICD-10-CM | POA: Diagnosis not present

## 2015-07-21 ENCOUNTER — Encounter: Payer: Self-pay | Admitting: Gynecology

## 2015-07-22 ENCOUNTER — Ambulatory Visit: Payer: Commercial Managed Care - HMO | Admitting: Gynecology

## 2015-07-31 ENCOUNTER — Encounter: Payer: Self-pay | Admitting: Gynecology

## 2015-07-31 ENCOUNTER — Ambulatory Visit (INDEPENDENT_AMBULATORY_CARE_PROVIDER_SITE_OTHER): Payer: Commercial Managed Care - HMO | Admitting: Gynecology

## 2015-07-31 VITALS — BP 116/70

## 2015-07-31 DIAGNOSIS — M8580 Other specified disorders of bone density and structure, unspecified site: Secondary | ICD-10-CM | POA: Diagnosis not present

## 2015-07-31 NOTE — Patient Instructions (Signed)
Follow up when you're due for your annual exam. 

## 2015-07-31 NOTE — Progress Notes (Signed)
Sandra Hall July 03, 1946 [id number]        69 y.o.  G1P0101 presents to discuss her most recent DEXA which shows a T score of -2.2 FRAX 19%/3.8%. Without statistically significant change from prior DEXA 2014.  Past medical history,surgical history, problem list, medications, allergies, family history and social history were all reviewed and documented in the EPIC chart.  Directed ROS with pertinent positives and negatives documented in the history of present illness/assessment and plan.  Exam: Filed Vitals:   07/31/15 1052  BP: 116/70   General appearance:  Normal   Assessment/Plan:  69 y.o. G1P0101 with stable bone density but increased FRAX for the hip exceeding the 3% recommended treatment level. I reviewed this with her. She is active with weightbearing exercise and takes adequate calcium. Recent vitamin D 81. Options for treatment now versus waiting and repeating her bone density in 2 years discussed. The risks benefits of treatment discussed to include the side effects of the medications. After lengthy discussion the patient prefers not to start treatment now to continue to be active with weightbearing exercise and then to repeat her bone density in 2 years recognizing the possibility of worsening osteopenia with increasing fracture risk.    Anastasio Auerbach MD, 11:37 AM 07/31/2015

## 2015-08-21 ENCOUNTER — Encounter (HOSPITAL_COMMUNITY): Payer: Self-pay | Admitting: *Deleted

## 2015-09-01 ENCOUNTER — Other Ambulatory Visit: Payer: Self-pay | Admitting: Gastroenterology

## 2015-09-06 NOTE — Anesthesia Preprocedure Evaluation (Addendum)
Anesthesia Evaluation  Patient identified by MRN, date of birth, ID band Patient awake    Reviewed: Allergy & Precautions, H&P , NPO status , Patient's Chart, lab work & pertinent test results  History of Anesthesia Complications (+) PONV  Airway Mallampati: II  TM Distance: >3 FB Neck ROM: Full    Dental no notable dental hx. (+) Teeth Intact, Dental Advisory Given   Pulmonary neg pulmonary ROS, former smoker,    Pulmonary exam normal breath sounds clear to auscultation       Cardiovascular negative cardio ROS   Rhythm:Regular Rate:Normal     Neuro/Psych negative neurological ROS  negative psych ROS   GI/Hepatic Neg liver ROS, GERD  Medicated and Controlled,  Endo/Other  negative endocrine ROS  Renal/GU negative Renal ROS  negative genitourinary   Musculoskeletal   Abdominal   Peds  Hematology negative hematology ROS (+)   Anesthesia Other Findings   Reproductive/Obstetrics negative OB ROS                            Anesthesia Physical Anesthesia Plan  ASA: II  Anesthesia Plan: MAC   Post-op Pain Management:    Induction: Intravenous  Airway Management Planned: Simple Face Mask  Additional Equipment:   Intra-op Plan:   Post-operative Plan:   Informed Consent: I have reviewed the patients History and Physical, chart, labs and discussed the procedure including the risks, benefits and alternatives for the proposed anesthesia with the patient or authorized representative who has indicated his/her understanding and acceptance.   Dental advisory given  Plan Discussed with: CRNA  Anesthesia Plan Comments:         Anesthesia Quick Evaluation

## 2015-09-07 ENCOUNTER — Ambulatory Visit (HOSPITAL_COMMUNITY): Payer: Commercial Managed Care - HMO | Admitting: Anesthesiology

## 2015-09-07 ENCOUNTER — Encounter (HOSPITAL_COMMUNITY)
Admission: RE | Disposition: A | Discharge status: Home / Self Care | Payer: Self-pay | Source: Ambulatory Visit | Attending: Gastroenterology

## 2015-09-07 ENCOUNTER — Encounter (HOSPITAL_COMMUNITY): Payer: Self-pay | Admitting: *Deleted

## 2015-09-07 ENCOUNTER — Ambulatory Visit (HOSPITAL_COMMUNITY)
Admission: RE | Admit: 2015-09-07 | Discharge: 2015-09-07 | Disposition: A | Discharge status: Home / Self Care | Payer: Commercial Managed Care - HMO | Source: Ambulatory Visit | Attending: Gastroenterology | Admitting: Gastroenterology

## 2015-09-07 DIAGNOSIS — K219 Gastro-esophageal reflux disease without esophagitis: Secondary | ICD-10-CM | POA: Insufficient documentation

## 2015-09-07 DIAGNOSIS — Z1211 Encounter for screening for malignant neoplasm of colon: Secondary | ICD-10-CM | POA: Diagnosis not present

## 2015-09-07 DIAGNOSIS — Z87891 Personal history of nicotine dependence: Secondary | ICD-10-CM | POA: Diagnosis not present

## 2015-09-07 DIAGNOSIS — D120 Benign neoplasm of cecum: Secondary | ICD-10-CM | POA: Diagnosis not present

## 2015-09-07 DIAGNOSIS — D122 Benign neoplasm of ascending colon: Secondary | ICD-10-CM | POA: Diagnosis not present

## 2015-09-07 DIAGNOSIS — K635 Polyp of colon: Secondary | ICD-10-CM | POA: Diagnosis not present

## 2015-09-07 HISTORY — PX: COLONOSCOPY WITH PROPOFOL: SHX5780

## 2015-09-07 HISTORY — DX: Other seasonal allergic rhinitis: J30.2

## 2015-09-07 HISTORY — DX: Nausea with vomiting, unspecified: R11.2

## 2015-09-07 HISTORY — DX: Other specified postprocedural states: Z98.890

## 2015-09-07 SURGERY — COLONOSCOPY WITH PROPOFOL
Anesthesia: Monitor Anesthesia Care

## 2015-09-07 MED ORDER — PROPOFOL 10 MG/ML IV BOLUS
INTRAVENOUS | Status: AC
  Filled 2015-09-07: qty 20

## 2015-09-07 MED ORDER — SODIUM CHLORIDE 0.9 % IV SOLN: INTRAVENOUS | Status: DC

## 2015-09-07 MED ORDER — ONDANSETRON HCL 4 MG/2ML IJ SOLN
INTRAMUSCULAR | Status: AC
  Filled 2015-09-07: qty 2

## 2015-09-07 MED ORDER — LACTATED RINGERS IV SOLN
INTRAVENOUS | Status: DC
  Administered 2015-09-07: 1000 mL via INTRAVENOUS

## 2015-09-07 MED ORDER — LIDOCAINE HCL (PF) 2 % IJ SOLN
INTRAMUSCULAR | Status: DC | PRN
  Administered 2015-09-07: 20 mg via INTRADERMAL

## 2015-09-07 MED ORDER — ONDANSETRON HCL 4 MG/2ML IJ SOLN
INTRAMUSCULAR | Status: DC | PRN
  Administered 2015-09-07: 4 mg via INTRAVENOUS

## 2015-09-07 MED ORDER — PROPOFOL 10 MG/ML IV BOLUS
INTRAVENOUS | Status: DC | PRN
  Administered 2015-09-07: 50 mg via INTRAVENOUS
  Administered 2015-09-07: 100 mg via INTRAVENOUS
  Administered 2015-09-07: 20 mg via INTRAVENOUS
  Administered 2015-09-07: 50 mg via INTRAVENOUS
  Administered 2015-09-07: 30 mg via INTRAVENOUS
  Administered 2015-09-07: 50 mg via INTRAVENOUS
  Administered 2015-09-07: 100 mg via INTRAVENOUS

## 2015-09-07 SURGICAL SUPPLY — 22 items

## 2015-09-07 NOTE — Anesthesia Postprocedure Evaluation (Signed)
  Anesthesia Post-op Note  Patient: Sandra Hall  Procedure(s) Performed: Procedure(s): COLONOSCOPY WITH PROPOFOL (N/A)  Patient Location: PACU  Anesthesia Type: MAC  Level of Consciousness: awake and alert   Airway and Oxygen Therapy: Patient Spontanous Breathing  Post-op Pain: Controlled  Post-op Assessment: Post-op Vital signs reviewed, Patient's Cardiovascular Status Stable and Respiratory Function Stable  Post-op Vital Signs: Reviewed  Filed Vitals:   09/07/15 1010  BP: 134/61  Pulse: 64  Temp:   Resp: 16    Complications: No apparent anesthesia complications

## 2015-09-07 NOTE — Op Note (Signed)
Procedure: Screening colonoscopy  Endoscopist: Earle Gell  Premedication: Propofol administered by anesthesia  Procedure: The patient was placed in the left lateral decubitus position. Anal inspection and digital rectal exam were normal. The Pentax pediatric colonoscope was introduced into the rectum and advanced to the cecum. A normal-appearing appendiceal orifice and ileocecal valve were identified. Colonic preparation for the exam today was good. Withdrawal time was 25 minutes  Rectum. Normal. Retroflexed view of the distal rectum was normal  Sigmoid colon and descending colon. Normal  Splenic flexure. Normal  Transverse colon. Normal  Hepatic flexure. Normal  Ascending colon. From the mid ascending colon. A 7 mm sessile serrated appearing polyp was lifted by saline injection and removed with the cold snare.  Cecum and ileocecal valve. From the distal cecum, a 10 mm sessile polyp was removed with the cold snare and cold biopsy forceps  Assessment: A small polyp was removed from the cecum and a small polyp was removed from the mid ascending colon with the cold snare. Otherwise normal screening colonoscopy.  Recommendation: I will review the polyp pathology to determine when a repeat colonoscopy should be performed

## 2015-09-07 NOTE — Transfer of Care (Signed)
Immediate Anesthesia Transfer of Care Note  Patient: Sandra Hall  Procedure(s) Performed: Procedure(s): COLONOSCOPY WITH PROPOFOL (N/A)  Patient Location: PACU  Anesthesia Type:MAC  Level of Consciousness:  sedated, patient cooperative and responds to stimulation  Airway & Oxygen Therapy:Patient Spontanous Breathing   Post-op Assessment:  Report given to PACU RN and Post -op Vital signs reviewed and stable  Post vital signs:  Reviewed and stable  Last Vitals:  Filed Vitals:   09/07/15 0736  BP: 142/66  Pulse: 71  Temp: 36.6 C  Resp: 17    Complications: No apparent anesthesia complications
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
## 2015-09-07 NOTE — Discharge Instructions (Signed)

## 2015-09-08 ENCOUNTER — Encounter (HOSPITAL_COMMUNITY): Payer: Self-pay | Admitting: Gastroenterology

## 2015-10-09 DIAGNOSIS — Z23 Encounter for immunization: Secondary | ICD-10-CM | POA: Diagnosis not present

## 2015-10-16 DIAGNOSIS — L239 Allergic contact dermatitis, unspecified cause: Secondary | ICD-10-CM | POA: Diagnosis not present

## 2015-11-04 DIAGNOSIS — H2513 Age-related nuclear cataract, bilateral: Secondary | ICD-10-CM | POA: Diagnosis not present

## 2015-11-04 DIAGNOSIS — H5200 Hypermetropia, unspecified eye: Secondary | ICD-10-CM | POA: Diagnosis not present

## 2015-11-04 DIAGNOSIS — H40013 Open angle with borderline findings, low risk, bilateral: Secondary | ICD-10-CM | POA: Diagnosis not present

## 2015-11-04 DIAGNOSIS — H25013 Cortical age-related cataract, bilateral: Secondary | ICD-10-CM | POA: Diagnosis not present

## 2015-12-29 DIAGNOSIS — L2389 Allergic contact dermatitis due to other agents: Secondary | ICD-10-CM | POA: Diagnosis not present

## 2016-05-12 DIAGNOSIS — J309 Allergic rhinitis, unspecified: Secondary | ICD-10-CM | POA: Diagnosis not present

## 2016-05-12 DIAGNOSIS — Z0000 Encounter for general adult medical examination without abnormal findings: Secondary | ICD-10-CM | POA: Diagnosis not present

## 2016-05-12 DIAGNOSIS — K219 Gastro-esophageal reflux disease without esophagitis: Secondary | ICD-10-CM | POA: Diagnosis not present

## 2016-05-12 DIAGNOSIS — Z1159 Encounter for screening for other viral diseases: Secondary | ICD-10-CM | POA: Diagnosis not present

## 2016-05-12 DIAGNOSIS — Z131 Encounter for screening for diabetes mellitus: Secondary | ICD-10-CM | POA: Diagnosis not present

## 2016-05-12 DIAGNOSIS — G4700 Insomnia, unspecified: Secondary | ICD-10-CM | POA: Diagnosis not present

## 2016-05-12 DIAGNOSIS — E7800 Pure hypercholesterolemia, unspecified: Secondary | ICD-10-CM | POA: Diagnosis not present

## 2016-05-27 ENCOUNTER — Encounter: Payer: Commercial Managed Care - HMO | Admitting: Gynecology

## 2016-05-30 ENCOUNTER — Ambulatory Visit (INDEPENDENT_AMBULATORY_CARE_PROVIDER_SITE_OTHER): Payer: Commercial Managed Care - HMO | Admitting: Gynecology

## 2016-05-30 ENCOUNTER — Encounter: Payer: Self-pay | Admitting: Gynecology

## 2016-05-30 VITALS — BP 126/80 | Ht 64.0 in | Wt 126.0 lb

## 2016-05-30 DIAGNOSIS — M8580 Other specified disorders of bone density and structure, unspecified site: Secondary | ICD-10-CM

## 2016-05-30 DIAGNOSIS — Z01419 Encounter for gynecological examination (general) (routine) without abnormal findings: Secondary | ICD-10-CM | POA: Diagnosis not present

## 2016-05-30 DIAGNOSIS — Z8041 Family history of malignant neoplasm of ovary: Secondary | ICD-10-CM | POA: Diagnosis not present

## 2016-05-30 DIAGNOSIS — N951 Menopausal and female climacteric states: Secondary | ICD-10-CM | POA: Diagnosis not present

## 2016-05-30 DIAGNOSIS — N952 Postmenopausal atrophic vaginitis: Secondary | ICD-10-CM | POA: Diagnosis not present

## 2016-05-30 NOTE — Progress Notes (Signed)
    Sandra Hall February 26, 1946 [id number]        70 y.o.  G1P0101  for breast and pelvic exam. Several issues noted below.  Past medical history,surgical history, problem list, medications, allergies, family history and social history were all reviewed and documented as reviewed in the EPIC chart.  ROS:  Performed with pertinent positives and negatives included in the history, assessment and plan.   Additional significant findings :  Family history of ovarian and breast cancer as discussed below.   Exam: Wandra Scot assistant Filed Vitals:   05/30/16 1046  BP: 126/80  Height: 5\' 4"  (1.626 m)  Weight: 126 lb (57.153 kg)   General appearance:  Normal affect, orientation and appearance. Skin: Grossly normal HEENT: Without gross lesions.  No cervical or supraclavicular adenopathy. Thyroid normal.  Lungs:  Clear without wheezing, rales or rhonchi Cardiac: RR, without RMG Abdominal:  Soft, nontender, without masses, guarding, rebound, organomegaly or hernia Breasts:  Examined lying and sitting without masses, retractions, discharge or axillary adenopathy. Pelvic:  Ext/BUS/va with atrophic changes  Adnexa without masses or tenderness    Anus and perineum normal   Rectovaginal normal sphincter tone without palpated masses or tenderness.    Assessment/Plan:  70 y.o. G1P0101 female fobreast and pelvic exam.  1. Postmenopausal/atrophic genital changes/menopausal symptoms.  Status post TVH RSO for endometriosis. Does have some hot flashes and sweats. Takes Iraan and has relief with this. No issues or vaginal dryness. It is not bad enough that she wants to consider HRT. 2. Family history of ovarian and breast cancer.  Maternal aunt with ovarian cancer, postmenopausal. Separate maternal aunt with breast cancer. This maternal aunt's daughter also with breast cancer.  reviewed possible genetic linkage. I asked her to discuss with her cousin the cell type diagnoses of her breast cancer and  whether she was screened genetically. I also recommended the patient make an appointment to see the genetic counselors at [facility] to see if they feel genetic testing is warranted as well as calculating her risk of breast cancer based on history. The various scenarios if positive to include prophylactic salpingo-oophorectomy of her remaining ovary and breast cancer surveillance or mastectomies discussed. Patient agrees with follow up and knows if she does not hear from the genetic counselors within 2 weeks to arrange the appointment she is to call my office.  3. Osteopenia.  DEXA 05/2015 T score -2.2 FRAX 19%/3.8%.  Vitamin D level 80 last year. Plan repeat DEXA next year at 2 year interval. 4. Mammography 06/2015. Continue with annual mammography when due. SBE monthly reviewed. 5. Colonoscopy 2016. Repeat at their recommended interval. 6. Pap smear 2012. No Pap smear done today. No history of significant abnormal Pap smears. We both agree to stop screening based on current recommendations based on age and history of hysterectomy for benign indications. 7. Health maintenance. No routine lab work done as this is reportedly done elsewhere. Follow up 1 year, sooner as needed.  Greater than 10 minutes of my time in excess of her breast and pelvic exam was spent in direct face to face counseling and coordination of care in regards to her problems of family history of rest cancer and ovarian cancer.   Anastasio Auerbach MD, 11:39 AM 05/30/2016

## 2016-05-30 NOTE — Patient Instructions (Signed)
Genetic counseling office will call you to arrange an appointment. Call my office if you do not hear from them within 2 weeks.  You may obtain a copy of any labs that were done today by logging onto MyChart as outlined in the instructions provided with your AVS (after visit summary). The office will not call with normal lab results but certainly if there are any significant abnormalities then we will contact you.   Health Maintenance Adopting a healthy lifestyle and getting preventive care can go a long way to promote health and wellness. Talk with your health care provider about what schedule of regular examinations is right for you. This is a good chance for you to check in with your provider about disease prevention and staying healthy. In between checkups, there are plenty of things you can do on your own. Experts have done a lot of research about which lifestyle changes and preventive measures are most likely to keep you healthy. Ask your health care provider for more information. WEIGHT AND DIET  Eat a healthy diet  Be sure to include plenty of vegetables, fruits, low-fat dairy products, and lean protein.  Do not eat a lot of foods high in solid fats, added sugars, or salt.  Get regular exercise. This is one of the most important things you can do for your health.  Most adults should exercise for at least 150 minutes each week. The exercise should increase your heart rate and make you sweat (moderate-intensity exercise).  Most adults should also do strengthening exercises at least twice a week. This is in addition to the moderate-intensity exercise.  Maintain a healthy weight  Body mass index (BMI) is a measurement that can be used to identify possible weight problems. It estimates body fat based on height and weight. Your health care provider can help determine your BMI and help you achieve or maintain a healthy weight.  For females 69 years of age and older:   A BMI below 18.5 is  considered underweight.  A BMI of 18.5 to 24.9 is normal.  A BMI of 25 to 29.9 is considered overweight.  A BMI of 30 and above is considered obese.  Watch levels of cholesterol and blood lipids  You should start having your blood tested at 69 years of age for lipids and cholesterol at 69 years of age, then have this test every 5 years.  You may need to have your cholesterol levels checked more often if:  Your lipid or cholesterol levels are high.  You are older than 69 years of age.  You are at high risk for heart disease.  CANCER SCREENING   Lung Cancer  Lung cancer screening is recommended for adults 54-82 years old who are at high risk for lung cancer because of a history of smoking.  Lung cancer screening is recommended for adults 69-82 years old who are at high risk for lung cancer because of a history of smoking.  A yearly low-dose CT scan of the lungs is recommended for people who:  Currently smoke.  Have quit within the past 15 years.  Have at least a 30-pack-year history of smoking. A pack year is smoking an average of one pack of cigarettes a day for 1 year.  Yearly screening should continue until it has been 15 years since you quit.  Yearly screening should stop if you develop a health problem that would prevent you from having lung cancer treatment.  Breast Cancer  Practice breast self-awareness. This means understanding how your breasts normally appear and feel.  It also means doing regular breast self-exams. Let your health care provider know about any changes, no matter how small.  If you are in your 20s or 30s, you should have a clinical breast exam (CBE) by a health care provider every 1-3 years as part of a regular health exam.  If you are 90 or older, If you are 69 or older, have a CBE every year. Also consider having a breast X-ray (mammogram) every year.  If you have a family history of breast cancer, talk to your health care provider about genetic screening.  If you are at high risk for breast cancer, talk to your health care provider about having an MRI and a mammogram every year.  Breast cancer gene (BRCA)  assessment is recommended for women who have family members with BRCA-related cancers. BRCA-related cancers include:  Breast.  Ovarian.  Tubal.  Peritoneal cancers.  Results of the assessment will determine the need for genetic counseling and BRCA1 and BRCA2 testing. Cervical Cancer Routine pelvic examinations to screen for cervical cancer are no longer recommended for nonpregnant women who are considered low risk for cancer of the pelvic organs (ovaries, uterus, and vagina) and who do not have symptoms. A pelvic examination may be necessary if you have symptoms including those associated with pelvic infections. Ask your health care provider if a screening pelvic exam is right for you.   The Pap test is the screening test for cervical cancer for women who are considered at risk.  If you had a hysterectomy for a problem that was not cancer or a condition that could lead to cancer, then you no longer need Pap tests.  If you are older than 65 years, and you have had normal Pap tests for the past 10 years, you no longer need to have Pap tests.  If you have had past treatment for cervical cancer or a condition that could lead to cancer, you need Pap tests and screening for cancer for at least 20 years after your treatment.  If you no longer get a Pap test, assess your risk factors if they change (such as having a new sexual partner). This can affect whether you should start being screened again.  Some women have medical problems that increase their chance of getting cervical cancer. If this is the case for you, your health care provider may recommend more frequent screening and Pap tests.  The human papillomavirus (HPV) test is another test that may be used for cervical cancer screening. The HPV test looks for the virus that can cause cell changes in the cervix. The cells collected during the Pap test can be tested for HPV.  The HPV test can be used to screen women 42 years of age and older.  Getting tested for HPV can extend the interval between normal Pap tests from three to five years.  An HPV test also should be used to screen women of any age who have unclear Pap test results.  After 69 years of age, women should have HPV testing as often as Pap tests.  Colorectal Cancer  This type of cancer can be detected and often prevented.  Routine colorectal cancer screening usually begins at 69 years of age and continues through 69 years of age.  Your health care provider may recommend screening at an earlier age if you have risk factors for colon cancer.  Your health care provider may also recommend using home test kits to check for hidden blood in the stool.  A small camera at the end of a tube can be used to examine your colon directly (sigmoidoscopy or colonoscopy). This is done to check for  the earliest forms of colorectal cancer.  Routine screening usually begins at age 71.  Direct examination of the colon should be repeated every 5-10 years through 69 years of age. However, you may need to be screened more often if early forms of precancerous polyps or small growths are found. Skin Cancer  Check your skin from head to toe regularly.  Tell your health care provider about any new moles or changes in moles, especially if there is a change in a mole's shape or color.  Also tell your health care provider if you have a mole that is larger than the size of a pencil eraser.  Always use sunscreen. Apply sunscreen liberally and repeatedly throughout the day.  Protect yourself by wearing long sleeves, pants, a wide-brimmed hat, and sunglasses whenever you are outside. HEART DISEASE, DIABETES, AND HIGH BLOOD PRESSURE   Have your blood pressure checked at least every 1-2 years. High blood pressure causes heart disease and increases the risk of stroke.  If you are between 84 years and 71 years old, ask your health care provider if you should take aspirin to prevent  strokes.  Have regular diabetes screenings. This involves taking a blood sample to check your fasting blood sugar level.  If you are at a normal weight and have a low risk for diabetes, have this test once every three years after 69 years of age.  If you are overweight and have a high risk for diabetes, consider being tested at a younger age or more often. PREVENTING INFECTION  Hepatitis B  If you have a higher risk for hepatitis B, you should be screened for this virus. You are considered at high risk for hepatitis B if:  You were born in a country where hepatitis B is common. Ask your health care provider which countries are considered high risk.  Your parents were born in a high-risk country, and you have not been immunized against hepatitis B (hepatitis B vaccine).  You have HIV or AIDS.  You use needles to inject street drugs.  You live with someone who has hepatitis B.  You have had sex with someone who has hepatitis B.  You get hemodialysis treatment.  You take certain medicines for conditions, including cancer, organ transplantation, and autoimmune conditions. Hepatitis C  Blood testing is recommended for:  Everyone born from [birth year] through 1964.  Anyone with known risk factors for hepatitis C. Sexually transmitted infections (STIs)  You should be screened for sexually transmitted infections (STIs) including gonorrhea and chlamydia if:  You are sexually active and are younger than 69 years of age.  You are older than 69 years of age and your health care provider tells you that you are at risk for this type of infection.  Your sexual activity has changed since you were last screened and you are at an increased risk for chlamydia or gonorrhea. Ask your health care provider if you are at risk.  If you do not have HIV, but are at risk, it may be recommended that you take a prescription medicine daily to prevent HIV infection. This is called pre-exposure prophylaxis  (PrEP). You are considered at risk if:  You are sexually active and do not regularly use condoms or know the HIV status of your partner(s).  You take drugs by injection.  You are sexually active with a partner who has HIV. Talk with your health care provider about whether you are at high risk of being infected with HIV. If you  choose to begin PrEP, you should first be tested for HIV. You should then be tested every 3 months for as long as you are taking PrEP.  PREGNANCY   If you are premenopausal and you may become pregnant, ask your health care provider about preconception counseling.  If you may become pregnant, take 400 to 800 micrograms (mcg) of folic acid every day.  If you want to prevent pregnancy, talk to your health care provider about birth control (contraception). OSTEOPOROSIS AND MENOPAUSE   Osteoporosis is a disease in which the bones lose minerals and strength with aging. This can result in serious bone fractures. Your risk for osteoporosis can be identified using a bone density scan.  If you are 31 years of age or older, or if you are at risk for osteoporosis and fractures, ask your health care provider if you should be screened.  Ask your health care provider whether you should take a calcium or vitamin D supplement to lower your risk for osteoporosis.  Menopause may have certain physical symptoms and risks.  Hormone replacement therapy may reduce some of these symptoms and risks. Talk to your health care provider about whether hormone replacement therapy is right for you.  HOME CARE INSTRUCTIONS   Schedule regular health, dental, and eye exams.  Stay current with your immunizations.   Do not use any tobacco products including cigarettes, chewing tobacco, or electronic cigarettes.  If you are pregnant, do not drink alcohol.  If you are breastfeeding, limit how much and how often you drink alcohol.  Limit alcohol intake to no more than 1 drink per day for  nonpregnant women. One drink equals 12 ounces of beer, 5 ounces of wine, or 1 ounces of hard liquor.  Do not use street drugs.  Do not share needles.  Ask your health care provider for help if you need support or information about quitting drugs.  Tell your health care provider if you often feel depressed.  Tell your health care provider if you have ever been abused or do not feel safe at home. Document Released: 05/30/2011 Document Revised: 03/31/2014 Document Reviewed: 10/16/2013 [facility] Patient Information 2015 [place], [place]. This information is not intended to replace advice given to you by your health care provider. Make sure you discuss any questions you have with your health care provider.

## 2016-06-08 DIAGNOSIS — M549 Dorsalgia, unspecified: Secondary | ICD-10-CM | POA: Diagnosis not present

## 2016-06-17 ENCOUNTER — Telehealth: Payer: Self-pay | Admitting: *Deleted

## 2016-06-17 DIAGNOSIS — Z803 Family history of malignant neoplasm of breast: Secondary | ICD-10-CM

## 2016-06-17 DIAGNOSIS — Z8041 Family history of malignant neoplasm of ovary: Secondary | ICD-10-CM

## 2016-06-17 NOTE — Telephone Encounter (Signed)
Dr.Fontaine noted on office visit 05/30/16 "I also recommended the patient make an appointment to see the genetic counselors at [facility] to see if they feel genetic testing is warranted as well as calculating her risk of breast cancer based on history."  Referral will be placed at genetic counselor they will contact pt to schedule.

## 2016-06-28 ENCOUNTER — Telehealth: Payer: Self-pay | Admitting: Genetic Counselor

## 2016-06-28 ENCOUNTER — Encounter: Payer: Self-pay | Admitting: Genetic Counselor

## 2016-06-28 NOTE — Telephone Encounter (Signed)
Appointment 08/16/16 @ 2:00pm

## 2016-06-28 NOTE — Telephone Encounter (Signed)
Pt confirmed appt, completed intake, in basket referring regarding appt date time, contacted pcp to obtain auth., mailed out pt letter.

## 2016-07-20 ENCOUNTER — Encounter: Payer: Self-pay | Admitting: Gynecology

## 2016-07-20 DIAGNOSIS — Z803 Family history of malignant neoplasm of breast: Secondary | ICD-10-CM | POA: Diagnosis not present

## 2016-07-20 DIAGNOSIS — Z1231 Encounter for screening mammogram for malignant neoplasm of breast: Secondary | ICD-10-CM | POA: Diagnosis not present

## 2016-08-15 ENCOUNTER — Encounter: Payer: Self-pay | Admitting: Genetic Counselor

## 2016-08-16 ENCOUNTER — Other Ambulatory Visit: Payer: Commercial Managed Care - HMO

## 2016-08-16 ENCOUNTER — Ambulatory Visit (HOSPITAL_BASED_OUTPATIENT_CLINIC_OR_DEPARTMENT_OTHER): Payer: Commercial Managed Care - HMO | Admitting: Genetic Counselor

## 2016-08-16 DIAGNOSIS — Z8041 Family history of malignant neoplasm of ovary: Secondary | ICD-10-CM

## 2016-08-16 DIAGNOSIS — Z803 Family history of malignant neoplasm of breast: Secondary | ICD-10-CM | POA: Diagnosis not present

## 2016-08-16 DIAGNOSIS — Z801 Family history of malignant neoplasm of trachea, bronchus and lung: Secondary | ICD-10-CM | POA: Diagnosis not present

## 2016-08-16 DIAGNOSIS — Z315 Encounter for genetic counseling: Secondary | ICD-10-CM

## 2016-08-17 ENCOUNTER — Encounter: Payer: Self-pay | Admitting: Genetic Counselor

## 2016-08-17 DIAGNOSIS — Z803 Family history of malignant neoplasm of breast: Secondary | ICD-10-CM | POA: Insufficient documentation

## 2016-08-17 DIAGNOSIS — Z8041 Family history of malignant neoplasm of ovary: Secondary | ICD-10-CM | POA: Insufficient documentation

## 2016-08-17 NOTE — Progress Notes (Signed)
REFERRING PROVIDER: Donalynn Furlong, MD Kelvin Cellar, RMA  PRIMARY PROVIDER:  Orpah Melter, MD  PRIMARY REASON FOR VISIT:  1. Family history of ovarian cancer   2. Family history of breast cancer in female   3. Family history of lung cancer      HISTORY OF PRESENT ILLNESS:   [name], a 70 y.o. female, was seen for a [place] cancer genetics consultation at the request of Dr. Phineas Real due to a family history of ovarian and breast cancers.  Ms. Rakers presents to clinic today to discuss the possibility of a hereditary predisposition to cancer, genetic testing, and to further clarify her future cancer risks, as well as potential cancer risks for family members.    Ms. Sher is a 70 y.o. female with no personal history of cancer.     HORMONAL RISK FACTORS:  Menarche was at age 75.  First live birth at age 53.  OCP use for approximately 7 years.  Ovaries intact: Still has left ovary.  Hysterectomy: yes, with USO at 70 to treat endometriosis.  Menopausal status: postmenopausal.  HRT use: 22 years. Colonoscopy: yes; most recent in 08/2015 found one tubular adenoma and one sessile serrated polyp; reports no additional history of polyps. Mammogram within the last year: yes. Number of breast biopsies: 1 (benign/cyst). Up to date with pelvic exams:  yes. Any excessive radiation exposure/other exposures in the past:  Reports additional history of secondhand smoke exposure  Past Medical History:  Diagnosis Date  . Acid reflux   . Insomnia   . Osteopenia 05/2015   T score -2.2 FRAX 19%/3.8%  . PONV (postoperative nausea and vomiting)    severe nausea and vomiting  . Seasonal allergies   . Stress fracture of foot    right foot-over past year occ. intermittent pain    Past Surgical History:  Procedure Laterality Date  . ABDOMINAL HYSTERECTOMY     Hyst and RSO for endometriosis  . BACK SURGERY    . COLONOSCOPY WITH PROPOFOL N/A 09/07/2015   Procedure: COLONOSCOPY WITH  PROPOFOL;  Surgeon: Garlan Fair, MD;  Location: [facility];  Service: Endoscopy;  Laterality: N/A;  . GANGLION CYST EXCISION Left    x2 left hand(1 elbow, 1 wrist)  . OOPHORECTOMY     RSO  . TONSILLECTOMY      Social History   Social History  . Marital status: Married    Spouse name: N/A  . Number of children: N/A  . Years of education: N/A   Social History Main Topics  . Smoking status: Former Smoker    Years: 35.00    Types: Cigarettes    Quit date: 08/21/1995  . Smokeless tobacco: Never Used     Comment: "casual smoker" - maybe smoked 2 pks per yr  . Alcohol use 0.6 oz/week    1 Standard drinks or equivalent per week     Comment: WINE -social  . Drug use: No  . Sexual activity: No     Comment: 1st intercourse 70 yo-More than 5 partners   Other Topics Concern  . None   Social History Narrative  . None     FAMILY HISTORY:  We obtained a detailed, 4-generation family history.  Significant diagnoses are listed below: Family History  Problem Relation Age of Onset  . Diabetes Mother   . Other Mother     asbestosis in lungs (husband worked around asbestos)  . Lung cancer Father 75    worked in Sales executive, around  absbestos; former smoker  . Diabetes Sister   . Other Sister     hx of hysterectomy for "femal issues"  . Ovarian cancer Maternal Aunt     dx 58-59  . Lung cancer Paternal Uncle     d. 40; smoker; worked around asbestos  . Diabetes Maternal Grandmother   . Heart attack Maternal Grandmother 85  . Emphysema Maternal Grandfather     d. late 60s-70  . Dementia Paternal Grandmother     d. 7-83  . Heart Problems Paternal Grandfather     d. early 76s  . Other Sister 36    hx of hysterectomy for non-cancerous mass in uterus  . ALS Maternal Aunt     d. 35; no other family members known to be affected  . Stroke Maternal Aunt 90  . Alzheimer's disease Maternal Aunt     d. 69  . Breast cancer Maternal Aunt     dx 83-84  . Breast cancer Cousin 40     "rare type"; s/p bilateral mastectomies  . Heart Problems Paternal Uncle   . Heart Problems Paternal Uncle   . Heart Problems Paternal Uncle     Ms. Mclear has one biological son who is 58.  She also has one adopted son who is 66.  She has two full sisters, ages 43 and 68.  Neither of her sisters have ever been diagnosed with cancer.  Both of her sisters have had hysterectomies in the past, unrelated to cancer.  Ms. Iglesia younger sister has one daughter who died of a 'severe aortic arrhythmia' at the age of 33.  This sister has four other sons who are healthy.    Ms. Guastella mother underwent a hip surgery and died of kidney failure shortly after at 29.  Her mother had eight full sisters, four of whom are still living and are in their 80s-80s.  One sister was diagnosed with ovarian cancer in her late 2s and passed away at 6.  She had two sons, neither of whom have had cancer.  Another sister was diagnosed with breast cancer in her early 39s.  She has three daughters and one son--one daughter was diagnosed with a "rare" form of breast cancer at 73, and she underwent bilateral mastectomies.  The daughter of this first cousin reportedly had negative genetic testing, but Ms. Gauss is unsure whether her affected cousin has had genetic testing.  One other maternal aunt died of ALS at 76; another died of a stroke at 52; and the final aunt died of alzheimer's-related illness at 46.  Ms. Cherian maternal grandmother passed away from a heart attack at 82.  Ms. Rommel maternal grandfather died of emphysema in his late 78s-70.  Ms. Mula reports no known history of cancer in her maternal great aunts/uncles.   Ms. Mcalpine father died of lung cancer at 45.  He was a former smoker and also worked in Sales executive around asbestos.  He had four full brothers, all of whom have passed away, most from heart-related causes and at later ages.  One brother died of lung cancer at 83.  He was also a smoker who worked  around asbestos.  Ms. Bancroft paternal grandmother died from dementia-related illness in her early 6s.  Her grandfather died of heart-related causes in his early 13s.  Ms. Childrey has no information for her paternal great aunts/uncles and great grandparents.  Patient's maternal ancestors are of [place] descent, and paternal ancestors are of [place] descent. There  is no reported Ashkenazi Jewish ancestry. There is no known consanguinity.  GENETIC COUNSELING ASSESSMENT: KAELYNNE CHRISTLEY is a 70 y.o. female with a family history of ovarian and breast cancers which is somewhat suggestive of a hereditary ovarian/breast cancer syndrome and predisposition to cancer. We, therefore, discussed and recommended the following at today's visit.   DISCUSSION: We reviewed the characteristics, features and inheritance patterns of hereditary cancer syndromes, particularly those caused by mutations within the BRCA1/2 and Lynch syndrome genes. We also discussed genetic testing, including the appropriate family members to test, the process of testing, insurance coverage and turn-around-time for results. We discussed the implications of a negative, positive and/or variant of uncertain significant result. We recommended Ms. Pineo pursue genetic testing for the 20-gene Breast/Ovarian Cancer Panel with MSH2 Exons 1-7 Inversion Analysis through [facility].  The Breast/Ovarian Cancer Panel offered by [facility] Hope Pigeon, MD) includes sequencing and deletion/duplication analysis for the following 19 genes:  ATM, BARD1, BRCA1, BRCA2, BRIP1, CDH1, CHEK2, FANCC, MLH1, MSH2, MSH6, NBN, PALB2, PMS2, PTEN, RAD51C, RAD51D, TP53, and XRCC2.  This panel also includes deletion/duplication analysis (without sequencing) for one gene, EPCAM.   Based on Ms. Fluitt's family history of cancer, she meets medical criteria for genetic testing. Despite that she meets criteria, she may still have an out of pocket cost. We  discussed that if her out of pocket cost for testing is over $100, the laboratory will call and confirm whether she wants to proceed with testing.  If the out of pocket cost of testing is less than $100 she will be billed by the genetic testing laboratory.   Based on the patient's personal and family history, a statistical model (IBIS/Tyrer-Cuzick) and literature data were used to estimate her risk of developing breast cancer. These estimate her lifetime risk of developing breast to be approximately 14.1%. This estimation does not take into account any genetic testing results.  The patient's lifetime breast cancer risk is a preliminary estimate based on available information using one of several models endorsed by the [place] (ACS). The ACS recommends consideration of breast MRI screening as an adjunct to mammography for patients at high risk (defined as 20% or greater lifetime risk). A more detailed breast cancer risk assessment can be considered, if clinically indicated.   PLAN: After considering the risks, benefits, and limitations, Ms. Cobbins  provided informed consent to pursue genetic testing and the blood sample was sent to [facility] for analysis of the 20-gene Breast/Ovarian Cancer Panel with MSH2 Exons 1-7 Inversion Analysis. Results should be available within approximately 2-3 weeks' time, at which point they will be disclosed by telephone to Ms. Kimmer, as will any additional recommendations warranted by these results. Ms. Waxman will receive a summary of her genetic counseling visit and a copy of her results once available. This information will also be available in Epic. We encouraged Ms. Rico to remain in contact with cancer genetics annually so that we can continuously update the family history and inform her of any changes in cancer genetics and testing that may be of benefit for her family. Ms. Zylka questions were answered to her satisfaction today. Our contact  information was provided should additional questions or concerns arise.  Thank you for the referral and allowing [place] to share in the care of your patient.   Jeanine Luz, MS, [facility] Certified Genetic Counselor [email or web address] Phone: [phone]  The patient was seen for a total of 60 minutes in face-to-face genetic counseling.  This patient  was discussed with Drs. Magrinat, Lindi Adie and/or Burr Medico who agrees with the above.    _______________________________________________________________________ For Office Staff:  Number of people involved in session: 1 Was an Intern/ student involved with case: no

## 2016-08-21 DIAGNOSIS — M62830 Muscle spasm of back: Secondary | ICD-10-CM | POA: Diagnosis not present

## 2016-09-02 ENCOUNTER — Telehealth: Payer: Self-pay | Admitting: Genetic Counselor

## 2016-09-05 ENCOUNTER — Ambulatory Visit: Payer: Self-pay | Admitting: Genetic Counselor

## 2016-09-05 DIAGNOSIS — K219 Gastro-esophageal reflux disease without esophagitis: Secondary | ICD-10-CM | POA: Diagnosis not present

## 2016-09-05 DIAGNOSIS — Z23 Encounter for immunization: Secondary | ICD-10-CM | POA: Diagnosis not present

## 2016-09-05 DIAGNOSIS — Z1379 Encounter for other screening for genetic and chromosomal anomalies: Secondary | ICD-10-CM

## 2016-09-05 DIAGNOSIS — Z803 Family history of malignant neoplasm of breast: Secondary | ICD-10-CM

## 2016-09-05 DIAGNOSIS — Z8041 Family history of malignant neoplasm of ovary: Secondary | ICD-10-CM

## 2016-09-05 NOTE — Telephone Encounter (Signed)
Discussed with Ms. Osentoski that her genetic test results were negative for mutations within any of 20 genes on the Breast/Ovarian Cancer Panel through [facility].  Additionally, no uncertain changes (variants of uncertain significance or VUSes) were found.  Discussed that this is most likely reassuring that Ms. Laible is not at an increased genetic risk for these cancers, although we discussed that we cannot completely rule out a genetic risk.  Most cancer is not genetic and many of Ms. Bloyd's relatives have lived to later ages in life and have never had cancer.  Discussed that genetic testing is still appropriate for her sisters and her maternal cousins, due to her aunt's history of ovarian cancer and the chance that there could be a mutation in the family that Ms. Sult herself just did not inherit.  Encouraged Ms. Ranganathan to keep her family history up-to-date with [place].  She should continue to follow her doctors' recommendations for future cancer screening.  She would like a copy of her test results mailed and I am happy to do that.  She knows she is welcome to call with any questions she may have.

## 2016-09-05 NOTE — Progress Notes (Signed)
GENETIC TEST RESULT  HPI: [name] was previously seen in the [place] clinic due to a family history of ovarian and breast cancer and concerns regarding a hereditary predisposition to cancer. Please refer to our prior cancer genetics clinic note from August 16, 2016 for more information regarding [name]'s medical, social and family histories, and our assessment and recommendations, at the time. [name] recent genetic test results were disclosed to her, as were recommendations warranted by these results. These results and recommendations are discussed in more detail below.  GENETIC TEST RESULTS: At the time of [name]'s visit on 08/14/16, we recommended she pursue genetic testing of the 20-gene Breast/Ovarian Cancer Panel with MSH2 Exons 1-7 Inversion Analysis through [facility].  The Breast/Ovarian Cancer Panel offered by [facility] Sandra Pigeon, MD) includes sequencing and deletion/duplication analysis for the following 19 genes:  ATM, BARD1, BRCA1, BRCA2, BRIP1, CDH1, CHEK2, FANCC, MLH1, MSH2, MSH6, NBN, PALB2, PMS2, PTEN, RAD51C, RAD51D, TP53, and XRCC2.  This panel also includes deletion/duplication analysis (without sequencing) for one gene, EPCAM.  Those results are now back, the report date for which is August 30, 2016.  Genetic testing was normal, and did not reveal a deleterious mutation in these genes.  Additionally, no variants of uncertain significance (VUSes) were found. The test report will be scanned into EPIC and will be located under the Results Review tab in the Pathology>Molecular Pathology section.   We discussed with [name] that since the current genetic testing is not perfect, it is possible there may be a gene mutation in one of these genes that current testing cannot detect, but that chance is small. We also discussed, that it is possible that another gene that has not yet been discovered, or that we have not yet tested, is  responsible for the cancer diagnoses in the family, and it is, therefore, important to remain in touch with cancer genetics in the future so that we can continue to offer [name] the most up-to-date genetic testing.   CANCER SCREENING RECOMMENDATIONS: This normal result is reassuring and indicates that [name] does not likely have an increased risk of cancer due to a mutation in one of these genes.  We, therefore, recommended  [name] continue to follow the cancer screening guidelines provided by her primary healthcare providers. Further genetic testing of other family members could also be helpful to further elucidation of the personal and familial cancer risks.  RECOMMENDATIONS FOR FAMILY MEMBERS: Women in this family might be at some increased risk of developing cancer, over the general population risk, simply due to the family history of cancer. We recommended women in this family have a yearly mammogram beginning at age 35, or 15 years younger than the earliest onset of cancer, an annual clinical breast exam, and perform monthly breast self-exams. Women in this family should also have a gynecological exam as recommended by their primary provider. All family members should have a colonoscopy by age 76.  Based on [name]'s maternal family history of ovarian and breast cancer, we recommended her sisters and maternal aunts and cousins consider having genetic counseling and testing.  There could still be a mutation in the family that [name] herself did not inherit.   [name] will let us know if we can be of any assistance in coordinating genetic counseling and/or testing for these family members.  Family members who live in different cities can also use the [employer] [place],  [employer], to find a cancer genetic counselor by zip code.     FOLLOW-UP: Lastly, we discussed with [name] that cancer genetics is a rapidly advancing field and it is possible that  new genetic tests will be appropriate for her and/or her family members in the future. We encouraged her to remain in contact with cancer genetics on an annual basis so we can update her personal and family histories and let her know of advances in cancer genetics that may benefit this family.   Our contact number was provided. [name] questions were answered to her satisfaction, and she knows she is welcome to call us at anytime with additional questions or concerns.   Jeanine Luz, MS, [facility] Certified Genetic Counselor [email or web address] Phone: [phone]

## 2016-09-08 DIAGNOSIS — M5137 Other intervertebral disc degeneration, lumbosacral region: Secondary | ICD-10-CM | POA: Diagnosis not present

## 2016-09-14 DIAGNOSIS — R030 Elevated blood-pressure reading, without diagnosis of hypertension: Secondary | ICD-10-CM | POA: Diagnosis not present

## 2016-09-14 DIAGNOSIS — M5137 Other intervertebral disc degeneration, lumbosacral region: Secondary | ICD-10-CM | POA: Diagnosis not present

## 2016-11-09 DIAGNOSIS — H2513 Age-related nuclear cataract, bilateral: Secondary | ICD-10-CM | POA: Diagnosis not present

## 2016-11-09 DIAGNOSIS — H5200 Hypermetropia, unspecified eye: Secondary | ICD-10-CM | POA: Diagnosis not present

## 2016-11-09 DIAGNOSIS — H40013 Open angle with borderline findings, low risk, bilateral: Secondary | ICD-10-CM | POA: Diagnosis not present

## 2016-11-09 DIAGNOSIS — H25013 Cortical age-related cataract, bilateral: Secondary | ICD-10-CM | POA: Diagnosis not present

## 2016-12-13 DIAGNOSIS — H04129 Dry eye syndrome of unspecified lacrimal gland: Secondary | ICD-10-CM | POA: Diagnosis not present

## 2016-12-13 DIAGNOSIS — H04222 Epiphora due to insufficient drainage, left lacrimal gland: Secondary | ICD-10-CM | POA: Diagnosis not present

## 2016-12-13 DIAGNOSIS — H04542 Stenosis of left lacrimal canaliculi: Secondary | ICD-10-CM | POA: Diagnosis not present

## 2017-01-12 DIAGNOSIS — L820 Inflamed seborrheic keratosis: Secondary | ICD-10-CM | POA: Diagnosis not present

## 2017-02-08 DIAGNOSIS — B078 Other viral warts: Secondary | ICD-10-CM | POA: Diagnosis not present

## 2017-02-08 DIAGNOSIS — D485 Neoplasm of uncertain behavior of skin: Secondary | ICD-10-CM | POA: Diagnosis not present

## 2017-02-28 ENCOUNTER — Encounter (HOSPITAL_COMMUNITY): Payer: Self-pay

## 2017-04-12 ENCOUNTER — Encounter: Payer: Self-pay | Admitting: Gynecology

## 2017-05-16 DIAGNOSIS — E7800 Pure hypercholesterolemia, unspecified: Secondary | ICD-10-CM | POA: Diagnosis not present

## 2017-05-16 DIAGNOSIS — G4700 Insomnia, unspecified: Secondary | ICD-10-CM | POA: Diagnosis not present

## 2017-05-16 DIAGNOSIS — Z0000 Encounter for general adult medical examination without abnormal findings: Secondary | ICD-10-CM | POA: Diagnosis not present

## 2017-05-16 DIAGNOSIS — K219 Gastro-esophageal reflux disease without esophagitis: Secondary | ICD-10-CM | POA: Diagnosis not present

## 2017-05-16 DIAGNOSIS — F430 Acute stress reaction: Secondary | ICD-10-CM | POA: Diagnosis not present

## 2017-05-16 DIAGNOSIS — R7303 Prediabetes: Secondary | ICD-10-CM | POA: Diagnosis not present

## 2017-05-16 DIAGNOSIS — M8588 Other specified disorders of bone density and structure, other site: Secondary | ICD-10-CM | POA: Diagnosis not present

## 2017-05-16 DIAGNOSIS — J309 Allergic rhinitis, unspecified: Secondary | ICD-10-CM | POA: Diagnosis not present

## 2017-05-25 ENCOUNTER — Encounter (HOSPITAL_COMMUNITY): Payer: Self-pay

## 2017-05-29 DIAGNOSIS — K219 Gastro-esophageal reflux disease without esophagitis: Secondary | ICD-10-CM | POA: Diagnosis not present

## 2017-05-29 DIAGNOSIS — R1013 Epigastric pain: Secondary | ICD-10-CM | POA: Diagnosis not present

## 2017-05-29 DIAGNOSIS — Z86010 Personal history of colonic polyps: Secondary | ICD-10-CM | POA: Diagnosis not present

## 2017-06-01 ENCOUNTER — Ambulatory Visit (INDEPENDENT_AMBULATORY_CARE_PROVIDER_SITE_OTHER): Payer: Medicare HMO | Admitting: Gynecology

## 2017-06-01 ENCOUNTER — Encounter: Payer: Self-pay | Admitting: Gynecology

## 2017-06-01 VITALS — BP 118/76 | Ht 64.0 in | Wt 122.0 lb

## 2017-06-01 DIAGNOSIS — Z01411 Encounter for gynecological examination (general) (routine) with abnormal findings: Secondary | ICD-10-CM

## 2017-06-01 DIAGNOSIS — N952 Postmenopausal atrophic vaginitis: Secondary | ICD-10-CM

## 2017-06-01 DIAGNOSIS — M8580 Other specified disorders of bone density and structure, unspecified site: Secondary | ICD-10-CM

## 2017-06-01 NOTE — Progress Notes (Signed)
    Sandra Hall 03-07-1946 [id number]        71 y.o.  G1P0101 for breast and pelvic exam.  Past medical history,surgical history, problem list, medications, allergies, family history and social history were all reviewed and documented as reviewed in the EPIC chart.  ROS:  Performed with pertinent positives and negatives included in the history, assessment and plan.   Additional significant findings :  None   Exam: [name] assistant Vitals:   06/01/17 1055  BP: 118/76  Weight: 122 lb (55.3 kg)  Height: 5\' 4"  (1.626 m)   Body mass index is 20.94 kg/m.  General appearance:  Normal affect, orientation and appearance. Skin: Grossly normal HEENT: Without gross lesions.  No cervical or supraclavicular adenopathy. Thyroid normal.  Lungs:  Clear without wheezing, rales or rhonchi Cardiac: RR, without RMG Abdominal:  Soft, nontender, without masses, guarding, rebound, organomegaly or hernia Breasts:  Examined lying and sitting without masses, retractions, discharge or axillary adenopathy. Pelvic:  Ext, BUS, Vagina: With atrophic changes  Adnexa: Without masses or tenderness    Anus and perineum: Normal   Rectovaginal: Normal sphincter tone without palpated masses or tenderness.    Assessment/Plan:  71 y.o. G25P0101 female for breast and pelvic exam.   1. Postmenopausal/atrophic genital changes. Status post TVH RSO in the past for endometriosis. No significant hot flushes, night sweats or vaginal dryness. 2. Osteopenia. DEXA 2016 T score -2.2 FRAX 19%/3.8%. Vitamin D level previously checked and great. Schedule DEXA now 2 year interval. 3. Family history of ovarian and breast cancer. Was genetically tested this past year and negative. Report is scanned in the media section of Epic. Mammography coming due in August and patient will schedule. SBE monthly reviewed. Breast exam normal today. 4. Colonoscopy 2016. Repeat at their recommended interval. 5. Pap smear 2012. No Pap smear  done today. No history of significant abnormal Pap smears. We both agree to stop screening per current screening guidelines. 6. Health maintenance. No routine lab work done as this is done elsewhere. Follow up 1 year, sooner as needed.   Anastasio Auerbach MD, 11:47 AM 06/01/2017

## 2017-06-01 NOTE — Patient Instructions (Signed)
Follow-up for the bone density as scheduled. 

## 2017-06-02 DIAGNOSIS — H04222 Epiphora due to insufficient drainage, left lacrimal gland: Secondary | ICD-10-CM | POA: Diagnosis not present

## 2017-06-02 DIAGNOSIS — H40013 Open angle with borderline findings, low risk, bilateral: Secondary | ICD-10-CM | POA: Diagnosis not present

## 2017-06-14 DIAGNOSIS — F430 Acute stress reaction: Secondary | ICD-10-CM | POA: Diagnosis not present

## 2017-06-29 ENCOUNTER — Ambulatory Visit (INDEPENDENT_AMBULATORY_CARE_PROVIDER_SITE_OTHER): Payer: Medicare HMO

## 2017-06-29 ENCOUNTER — Other Ambulatory Visit: Payer: Self-pay | Admitting: Gynecology

## 2017-06-29 DIAGNOSIS — Z780 Asymptomatic menopausal state: Secondary | ICD-10-CM | POA: Diagnosis not present

## 2017-06-29 DIAGNOSIS — M8589 Other specified disorders of bone density and structure, multiple sites: Secondary | ICD-10-CM

## 2017-06-29 DIAGNOSIS — M8580 Other specified disorders of bone density and structure, unspecified site: Secondary | ICD-10-CM

## 2017-06-30 ENCOUNTER — Telehealth: Payer: Self-pay | Admitting: Gynecology

## 2017-06-30 ENCOUNTER — Encounter: Payer: Self-pay | Admitting: Gynecology

## 2017-06-30 NOTE — Telephone Encounter (Signed)
Tell patient her most recent bone density does show a little bit of loss from the bones. Her calculated fracture risk is increased. Recommend office visit to discuss treatment options.

## 2017-06-30 NOTE — Telephone Encounter (Signed)
Pt informed- will call back to schedule.

## 2017-07-21 DIAGNOSIS — Z1231 Encounter for screening mammogram for malignant neoplasm of breast: Secondary | ICD-10-CM | POA: Diagnosis not present

## 2017-07-27 DIAGNOSIS — M25521 Pain in right elbow: Secondary | ICD-10-CM | POA: Diagnosis not present

## 2017-08-04 ENCOUNTER — Ambulatory Visit (INDEPENDENT_AMBULATORY_CARE_PROVIDER_SITE_OTHER): Payer: Medicare HMO | Admitting: Gynecology

## 2017-08-04 ENCOUNTER — Encounter: Payer: Self-pay | Admitting: Gynecology

## 2017-08-04 VITALS — BP 118/76

## 2017-08-04 DIAGNOSIS — M8589 Other specified disorders of bone density and structure, multiple sites: Secondary | ICD-10-CM

## 2017-08-04 DIAGNOSIS — M8580 Other specified disorders of bone density and structure, unspecified site: Secondary | ICD-10-CM

## 2017-08-04 NOTE — Progress Notes (Signed)
    Sandra Hall [birth year] [id number]        71 y.o.  G1P0101 presents to discuss her most recent bone density which shows a T score of -2.3. When compared to her prior study she had a 2-3% loss from the AP spine and no change in her hips. Calculated fracture risk based on maternal parent hip fracture history 20% and 7%. Patient does have a history of stress fracture in her foot. No other fractures. Reviewed her maternal hip fracture history and it sounds more degenerative that she had a hip replacement on both sides in her 57s of unclear whether true fracture versus degenerative changes.  If the parental hip fracture is backed out of her FRAX then calculated fracture risk is 13%/3.8%  Past medical history,surgical history, problem list, medications, allergies, family history and social history were all reviewed and documented in the EPIC chart.  Directed ROS with pertinent positives and negatives documented in the history of present illness/assessment and plan.  Exam: Vitals:   08/04/17 1136  BP: 118/76   General appearance:  Normal   Assessment/Plan:  71 y.o. G1P0101 I reviewed the patient's history as above with her and the issues of maternal fracture history versus not and her FRAX calculated risks. Overall her bone density is premeds stable from her prior study. Options for treatment now versus observation reviewed. She does exercise on a regular basis using a trainer with weightbearing exercise, supplements calcium and vitamin D daily. Options for treatment to include bisphosphonates, Prolia, Evista reviewed. Pros and cons as well as risks and side effects discussed. Osteonecrosis of the jaw, atypical fractures, GERD, rashes, infections all reviewed. At this point the patient is not interested initiating medication but prefers to continue with her weightbearing exercise, calcium and vitamin D supplementation and repeating her bone density in 2 years.  Greater than 50% of my time was spent  in direct face to face counseling and coordination of care with the patient.     Anastasio Auerbach MD, 12:04 PM 08/04/2017

## 2017-08-04 NOTE — Patient Instructions (Signed)
Follow up for your annual exam when you're due.

## 2017-08-11 ENCOUNTER — Other Ambulatory Visit: Payer: Self-pay | Admitting: Orthopedic Surgery

## 2017-08-11 ENCOUNTER — Other Ambulatory Visit: Payer: Self-pay

## 2017-08-11 DIAGNOSIS — M25521 Pain in right elbow: Secondary | ICD-10-CM

## 2017-08-11 DIAGNOSIS — G5631 Lesion of radial nerve, right upper limb: Secondary | ICD-10-CM

## 2017-08-24 DIAGNOSIS — J029 Acute pharyngitis, unspecified: Secondary | ICD-10-CM | POA: Diagnosis not present

## 2017-08-29 ENCOUNTER — Ambulatory Visit
Admission: RE | Admit: 2017-08-29 | Discharge: 2017-08-29 | Disposition: A | Discharge status: Home / Self Care | Payer: Commercial Managed Care - HMO | Source: Ambulatory Visit | Attending: Orthopedic Surgery | Admitting: Orthopedic Surgery

## 2017-08-29 DIAGNOSIS — M25521 Pain in right elbow: Secondary | ICD-10-CM | POA: Diagnosis not present

## 2017-08-29 DIAGNOSIS — G5631 Lesion of radial nerve, right upper limb: Secondary | ICD-10-CM

## 2017-09-06 DIAGNOSIS — Z23 Encounter for immunization: Secondary | ICD-10-CM | POA: Diagnosis not present

## 2017-09-07 DIAGNOSIS — G5631 Lesion of radial nerve, right upper limb: Secondary | ICD-10-CM | POA: Diagnosis not present

## 2017-10-05 DIAGNOSIS — G5631 Lesion of radial nerve, right upper limb: Secondary | ICD-10-CM | POA: Diagnosis not present

## 2017-11-23 DIAGNOSIS — F419 Anxiety disorder, unspecified: Secondary | ICD-10-CM | POA: Diagnosis not present

## 2017-11-23 DIAGNOSIS — M542 Cervicalgia: Secondary | ICD-10-CM | POA: Diagnosis not present

## 2017-12-07 DIAGNOSIS — H25013 Cortical age-related cataract, bilateral: Secondary | ICD-10-CM | POA: Diagnosis not present

## 2017-12-07 DIAGNOSIS — H2513 Age-related nuclear cataract, bilateral: Secondary | ICD-10-CM | POA: Diagnosis not present

## 2017-12-07 DIAGNOSIS — H40013 Open angle with borderline findings, low risk, bilateral: Secondary | ICD-10-CM | POA: Diagnosis not present

## 2017-12-07 DIAGNOSIS — H04129 Dry eye syndrome of unspecified lacrimal gland: Secondary | ICD-10-CM | POA: Diagnosis not present

## 2018-02-08 DIAGNOSIS — L710 Perioral dermatitis: Secondary | ICD-10-CM | POA: Diagnosis not present

## 2018-02-08 DIAGNOSIS — L738 Other specified follicular disorders: Secondary | ICD-10-CM | POA: Diagnosis not present

## 2018-04-11 DIAGNOSIS — L710 Perioral dermatitis: Secondary | ICD-10-CM | POA: Diagnosis not present

## 2018-05-18 DIAGNOSIS — E7800 Pure hypercholesterolemia, unspecified: Secondary | ICD-10-CM | POA: Diagnosis not present

## 2018-05-18 DIAGNOSIS — G4700 Insomnia, unspecified: Secondary | ICD-10-CM | POA: Diagnosis not present

## 2018-05-18 DIAGNOSIS — Z0000 Encounter for general adult medical examination without abnormal findings: Secondary | ICD-10-CM | POA: Diagnosis not present

## 2018-05-18 DIAGNOSIS — K219 Gastro-esophageal reflux disease without esophagitis: Secondary | ICD-10-CM | POA: Diagnosis not present

## 2018-05-18 DIAGNOSIS — J309 Allergic rhinitis, unspecified: Secondary | ICD-10-CM | POA: Diagnosis not present

## 2018-05-18 DIAGNOSIS — Z23 Encounter for immunization: Secondary | ICD-10-CM | POA: Diagnosis not present

## 2018-05-18 DIAGNOSIS — L814 Other melanin hyperpigmentation: Secondary | ICD-10-CM | POA: Diagnosis not present

## 2018-05-18 DIAGNOSIS — L718 Other rosacea: Secondary | ICD-10-CM | POA: Diagnosis not present

## 2018-05-18 DIAGNOSIS — F419 Anxiety disorder, unspecified: Secondary | ICD-10-CM | POA: Diagnosis not present

## 2018-05-18 DIAGNOSIS — Z131 Encounter for screening for diabetes mellitus: Secondary | ICD-10-CM | POA: Diagnosis not present

## 2018-05-18 DIAGNOSIS — M8588 Other specified disorders of bone density and structure, other site: Secondary | ICD-10-CM | POA: Diagnosis not present

## 2018-06-05 DIAGNOSIS — H43811 Vitreous degeneration, right eye: Secondary | ICD-10-CM | POA: Diagnosis not present

## 2018-06-05 DIAGNOSIS — H43391 Other vitreous opacities, right eye: Secondary | ICD-10-CM | POA: Diagnosis not present

## 2018-06-05 DIAGNOSIS — H3561 Retinal hemorrhage, right eye: Secondary | ICD-10-CM | POA: Diagnosis not present

## 2018-06-06 DIAGNOSIS — H3561 Retinal hemorrhage, right eye: Secondary | ICD-10-CM | POA: Diagnosis not present

## 2018-06-06 DIAGNOSIS — H2511 Age-related nuclear cataract, right eye: Secondary | ICD-10-CM | POA: Diagnosis not present

## 2018-06-06 DIAGNOSIS — H43811 Vitreous degeneration, right eye: Secondary | ICD-10-CM | POA: Diagnosis not present

## 2018-06-07 ENCOUNTER — Encounter: Payer: Self-pay | Admitting: Gynecology

## 2018-06-07 ENCOUNTER — Ambulatory Visit: Payer: Medicare HMO | Admitting: Gynecology

## 2018-06-07 VITALS — BP 120/76 | Ht 64.0 in | Wt 122.0 lb

## 2018-06-07 DIAGNOSIS — Z01419 Encounter for gynecological examination (general) (routine) without abnormal findings: Secondary | ICD-10-CM | POA: Diagnosis not present

## 2018-06-07 DIAGNOSIS — N952 Postmenopausal atrophic vaginitis: Secondary | ICD-10-CM

## 2018-06-07 DIAGNOSIS — Z9189 Other specified personal risk factors, not elsewhere classified: Secondary | ICD-10-CM

## 2018-06-07 DIAGNOSIS — M8580 Other specified disorders of bone density and structure, unspecified site: Secondary | ICD-10-CM

## 2018-06-07 NOTE — Progress Notes (Signed)
    Sandra Hall 1946-03-24 [id number]        72 y.o.  G1P0101 for breast and pelvic exam  Past medical history,surgical history, problem list, medications, allergies, family history and social history were all reviewed and documented as reviewed in the EPIC chart.  ROS:  Performed with pertinent positives and negatives included in the history, assessment and plan.   Additional significant findings : None   Exam: [name] assistant Vitals:   06/07/18 1111  BP: 120/76  Weight: 122 lb (55.3 kg)  Height: 5\' 4"  (1.626 m)   Body mass index is 20.94 kg/m.  General appearance:  Normal affect, orientation and appearance. Skin: Grossly normal HEENT: Without gross lesions.  No cervical or supraclavicular adenopathy. Thyroid normal.  Lungs:  Clear without wheezing, rales or rhonchi Cardiac: RR, without RMG Abdominal:  Soft, nontender, without masses, guarding, rebound, organomegaly or hernia Breasts:  Examined lying and sitting without masses, retractions, discharge or axillary adenopathy. Pelvic:  Ext, BUS, Vagina: With atrophic changes  Adnexa: Without masses or tenderness    Anus and perineum: Normal   Rectovaginal: Normal sphincter tone without palpated masses or tenderness.    Assessment/Plan:  72 y.o. G30P0101 female for breast and pelvic exam  1. Postmenopausal/atrophic genital changes.  Status post TAH RSO in the past for endometriosis.  No significant menopausal symptoms. 2. Mammography scheduled next month and she will follow-up for this.  Breast exam normal today.  Family history of breast cancer and ovarian cancer.  She was genetically tested last year and negative.  3. Osteopenia.  DEXA 2018 T score -2.3.  FRAX 13% / 3.8%.  We had discussed options for treatment 08/04/2017 and she declined.  Plan repeat DEXA next year at 2-year interval. 4. Colonoscopy 2016.  Repeat at their recommended interval. 5. Pap smear 2012.  No Pap smear done today.  No history of abnormal Pap  smears.  We both agreed to stop screening based on age and hysterectomy history. 6. Health maintenance.  No routine lab work done as patient does this elsewhere.  Follow-up 1 year, sooner as needed.   Anastasio Auerbach MD, 11:38 AM 06/07/2018

## 2018-06-07 NOTE — Patient Instructions (Signed)
Follow-up in 1 year for annual exam, sooner as needed. 

## 2018-06-22 DIAGNOSIS — H04129 Dry eye syndrome of unspecified lacrimal gland: Secondary | ICD-10-CM | POA: Diagnosis not present

## 2018-06-22 DIAGNOSIS — H04222 Epiphora due to insufficient drainage, left lacrimal gland: Secondary | ICD-10-CM | POA: Diagnosis not present

## 2018-06-22 DIAGNOSIS — H1013 Acute atopic conjunctivitis, bilateral: Secondary | ICD-10-CM | POA: Diagnosis not present

## 2018-06-22 DIAGNOSIS — H40013 Open angle with borderline findings, low risk, bilateral: Secondary | ICD-10-CM | POA: Diagnosis not present

## 2018-07-17 DIAGNOSIS — H2511 Age-related nuclear cataract, right eye: Secondary | ICD-10-CM | POA: Diagnosis not present

## 2018-07-17 DIAGNOSIS — H43811 Vitreous degeneration, right eye: Secondary | ICD-10-CM | POA: Diagnosis not present

## 2018-07-17 DIAGNOSIS — H3561 Retinal hemorrhage, right eye: Secondary | ICD-10-CM | POA: Diagnosis not present

## 2018-07-24 ENCOUNTER — Encounter: Payer: Self-pay | Admitting: Gynecology

## 2018-07-24 DIAGNOSIS — Z1231 Encounter for screening mammogram for malignant neoplasm of breast: Secondary | ICD-10-CM | POA: Diagnosis not present

## 2018-08-13 DIAGNOSIS — R1013 Epigastric pain: Secondary | ICD-10-CM | POA: Diagnosis not present

## 2018-08-13 DIAGNOSIS — Z86010 Personal history of colonic polyps: Secondary | ICD-10-CM | POA: Diagnosis not present

## 2018-08-15 DIAGNOSIS — K219 Gastro-esophageal reflux disease without esophagitis: Secondary | ICD-10-CM | POA: Diagnosis not present

## 2018-08-15 DIAGNOSIS — K635 Polyp of colon: Secondary | ICD-10-CM | POA: Diagnosis not present

## 2018-08-15 DIAGNOSIS — D122 Benign neoplasm of ascending colon: Secondary | ICD-10-CM | POA: Diagnosis not present

## 2018-08-15 DIAGNOSIS — D123 Benign neoplasm of transverse colon: Secondary | ICD-10-CM | POA: Diagnosis not present

## 2018-08-15 DIAGNOSIS — Z86010 Personal history of colonic polyps: Secondary | ICD-10-CM | POA: Diagnosis not present

## 2018-08-17 DIAGNOSIS — K635 Polyp of colon: Secondary | ICD-10-CM | POA: Diagnosis not present

## 2018-08-17 DIAGNOSIS — D122 Benign neoplasm of ascending colon: Secondary | ICD-10-CM | POA: Diagnosis not present

## 2018-08-17 DIAGNOSIS — D123 Benign neoplasm of transverse colon: Secondary | ICD-10-CM | POA: Diagnosis not present

## 2018-09-06 DIAGNOSIS — Z23 Encounter for immunization: Secondary | ICD-10-CM | POA: Diagnosis not present

## 2018-11-16 DIAGNOSIS — F419 Anxiety disorder, unspecified: Secondary | ICD-10-CM | POA: Diagnosis not present

## 2018-11-16 DIAGNOSIS — K219 Gastro-esophageal reflux disease without esophagitis: Secondary | ICD-10-CM | POA: Diagnosis not present

## 2018-11-16 DIAGNOSIS — G4700 Insomnia, unspecified: Secondary | ICD-10-CM | POA: Diagnosis not present

## 2019-01-24 DIAGNOSIS — H25013 Cortical age-related cataract, bilateral: Secondary | ICD-10-CM | POA: Diagnosis not present

## 2019-01-24 DIAGNOSIS — H2513 Age-related nuclear cataract, bilateral: Secondary | ICD-10-CM | POA: Diagnosis not present

## 2019-01-24 DIAGNOSIS — H40013 Open angle with borderline findings, low risk, bilateral: Secondary | ICD-10-CM | POA: Diagnosis not present

## 2019-01-24 DIAGNOSIS — H43811 Vitreous degeneration, right eye: Secondary | ICD-10-CM | POA: Diagnosis not present

## 2019-02-12 DIAGNOSIS — R450 Nervousness: Secondary | ICD-10-CM | POA: Diagnosis not present

## 2019-03-05 DIAGNOSIS — N289 Disorder of kidney and ureter, unspecified: Secondary | ICD-10-CM | POA: Diagnosis not present

## 2019-04-26 DIAGNOSIS — L255 Unspecified contact dermatitis due to plants, except food: Secondary | ICD-10-CM | POA: Diagnosis not present

## 2019-05-22 DIAGNOSIS — F419 Anxiety disorder, unspecified: Secondary | ICD-10-CM | POA: Diagnosis not present

## 2019-05-22 DIAGNOSIS — G4700 Insomnia, unspecified: Secondary | ICD-10-CM | POA: Diagnosis not present

## 2019-05-22 DIAGNOSIS — E7800 Pure hypercholesterolemia, unspecified: Secondary | ICD-10-CM | POA: Diagnosis not present

## 2019-05-22 DIAGNOSIS — Z0000 Encounter for general adult medical examination without abnormal findings: Secondary | ICD-10-CM | POA: Diagnosis not present

## 2019-05-22 DIAGNOSIS — R7303 Prediabetes: Secondary | ICD-10-CM | POA: Diagnosis not present

## 2019-06-10 ENCOUNTER — Other Ambulatory Visit: Payer: Self-pay

## 2019-06-11 ENCOUNTER — Encounter: Payer: Self-pay | Admitting: Gynecology

## 2019-06-11 ENCOUNTER — Ambulatory Visit (INDEPENDENT_AMBULATORY_CARE_PROVIDER_SITE_OTHER): Payer: Medicare HMO | Admitting: Gynecology

## 2019-06-11 VITALS — BP 118/74 | Ht 64.0 in | Wt 123.0 lb

## 2019-06-11 DIAGNOSIS — Z9189 Other specified personal risk factors, not elsewhere classified: Secondary | ICD-10-CM

## 2019-06-11 DIAGNOSIS — Z01419 Encounter for gynecological examination (general) (routine) without abnormal findings: Secondary | ICD-10-CM | POA: Diagnosis not present

## 2019-06-11 DIAGNOSIS — N952 Postmenopausal atrophic vaginitis: Secondary | ICD-10-CM

## 2019-06-11 DIAGNOSIS — M8580 Other specified disorders of bone density and structure, unspecified site: Secondary | ICD-10-CM

## 2019-06-11 NOTE — Patient Instructions (Signed)
Schedule in follow-up for your bone density.  Follow-up in 1 year for annual exam

## 2019-06-11 NOTE — Progress Notes (Signed)
    Sandra Hall 01-07-1946 [id number]        73 y.o.  G1P0101 for breast and pelvic exam.  Without gynecologic complaints  Past medical history,surgical history, problem list, medications, allergies, family history and social history were all reviewed and documented as reviewed in the EPIC chart.  ROS:  Performed with pertinent positives and negatives included in the history, assessment and plan.   Additional significant findings : None   Exam: [name] assistant Vitals:   06/11/19 1139  BP: 118/74  Weight: 123 lb (55.8 kg)  Height: 5\' 4"  (1.626 m)   Body mass index is 21.11 kg/m.  General appearance:  Normal affect, orientation and appearance. Skin: Grossly normal HEENT: Without gross lesions.  No cervical or supraclavicular adenopathy. Thyroid normal.  Lungs:  Clear without wheezing, rales or rhonchi Cardiac: RR, without RMG Abdominal:  Soft, nontender, without masses, guarding, rebound, organomegaly or hernia Breasts:  Examined lying and sitting without masses, retractions, discharge or axillary adenopathy. Pelvic:  Ext, BUS, Vagina: With atrophic changes  Adnexa: Without masses or tenderness    Anus and perineum: Normal   Rectovaginal: Normal sphincter tone without palpated masses or tenderness.    Assessment/Plan:  73 y.o. G33P0101 female for breast and pelvic exam.  Status post TAH RSO in the past for endometriosis.    1. Postmenopausal.  Having some hot flushes particularly in hot weather.  Otherwise doing well.   2. Osteopenia.  DEXA 2018 T score -2.3 FRAX 13% / 3.8%.  We talked about increased hip fracture risk and options for treatment and she declined in the past.  Recommend follow-up DEXA now at 2-year interval.  Patient will schedule in follow-up for this 3. Mammography 06/2018.  Continue with annual mammography this coming fall.  Breast exam normal today. 4. Pap smear 2012.  No Pap smear done today.  No history of abnormal Pap smears.  We both agree to stop  screening per current screening guidelines based on hysterectomy and age. 5. Colonoscopy 2019.  Repeat at their recommended interval. 6. Health maintenance.  No routine lab work done as patient does this elsewhere.  Follow-up 1 year, sooner as needed.   Anastasio Auerbach MD, 12:13 PM 06/11/2019

## 2019-06-20 IMAGING — MR MR ELBOW*R* W/O CM
4 series · 17 of 40 positions shown · non-contrast
Comparison: None.

CLINICAL DATA: Medial right elbow pain.

EXAM:
MRI OF THE RIGHT ELBOW WITHOUT CONTRAST
TECHNIQUE: Multiplanar, multisequence MR imaging of the elbow was performed. No
intravenous contrast was administered.

[Series 5: T1 · axial · 3.5mm · 0.19mm/px · z∈[-29,+46]mm · 3 of 24 slices shown]
[im 3/24]
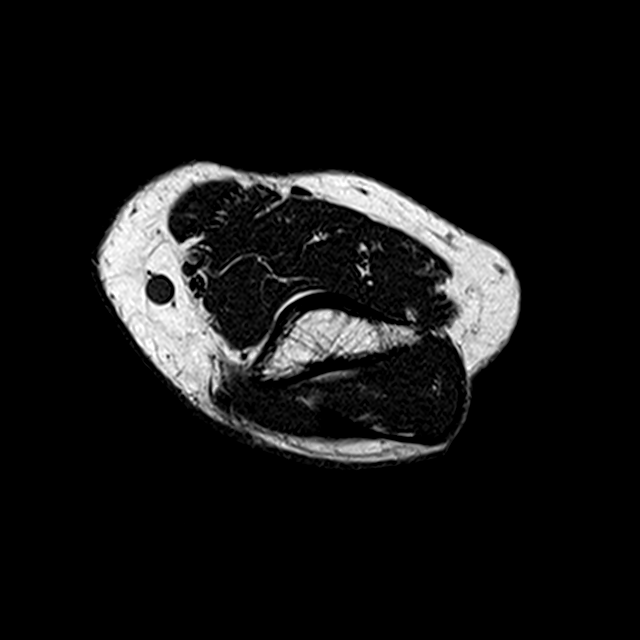
[im 12/24]
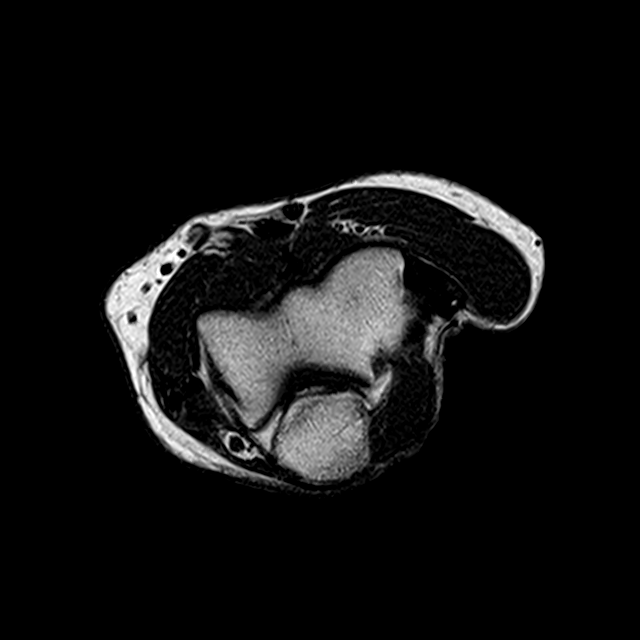
[im 21/24]
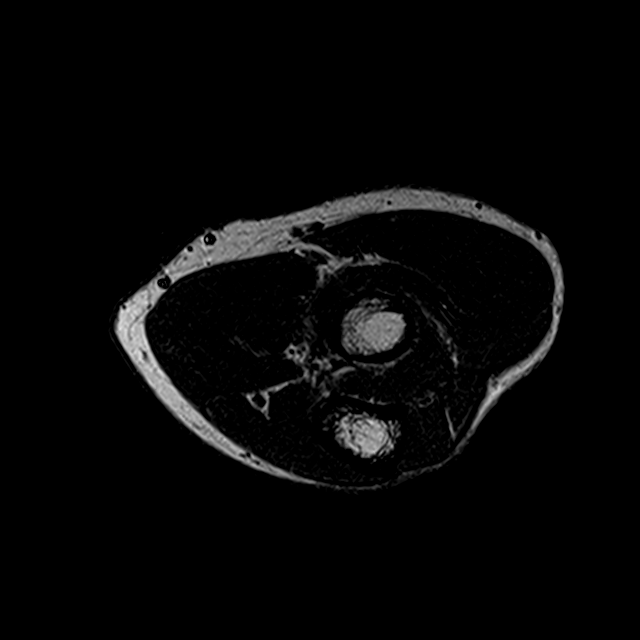

[Series 6: T2 fat-sat · axial · 3.5mm · 0.23mm/px · z∈[-29,+46]mm · 3 of 24 slices shown (1 of 2)]
[im 3/24]
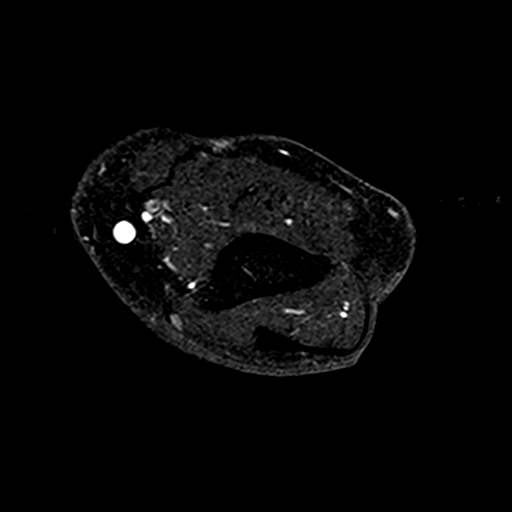
[im 12/24]
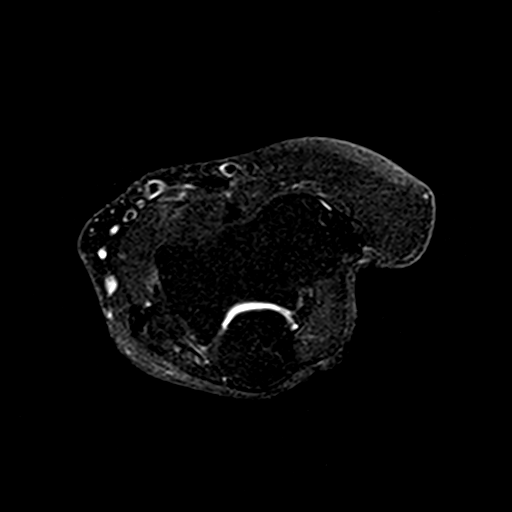
[im 21/24]
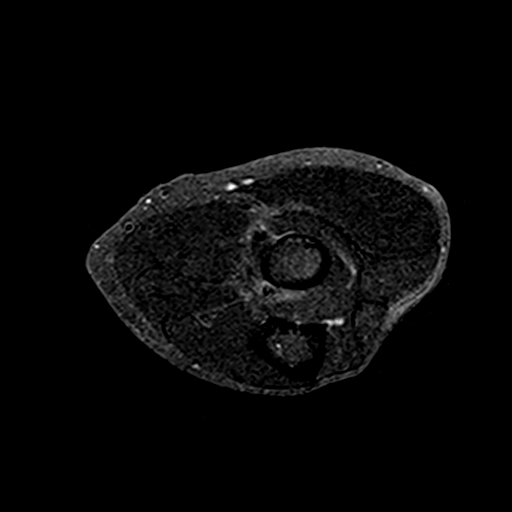

[Series 7: PD fat-sat · sagittal · 3.2mm · 0.22mm/px · 8 of 20 slices shown]
[im 1/20]
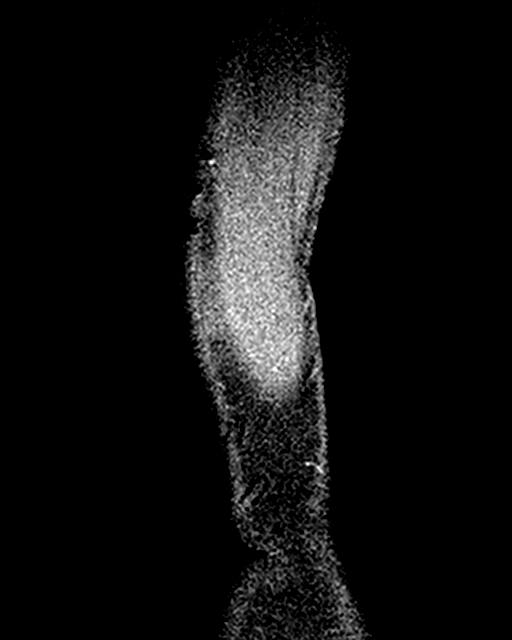
[im 3/20]
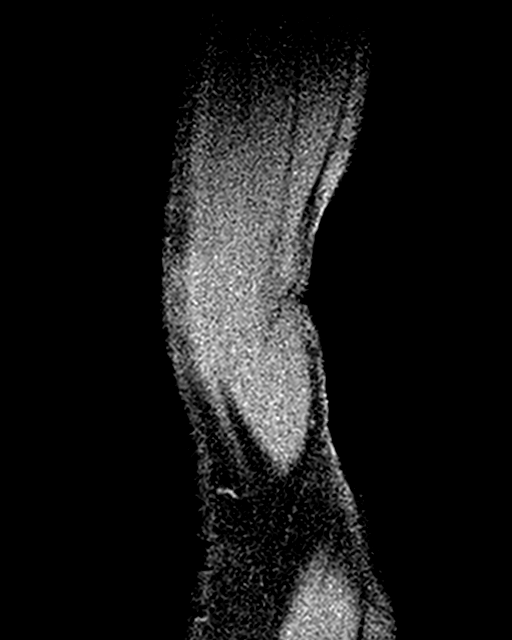
[im 5/20]
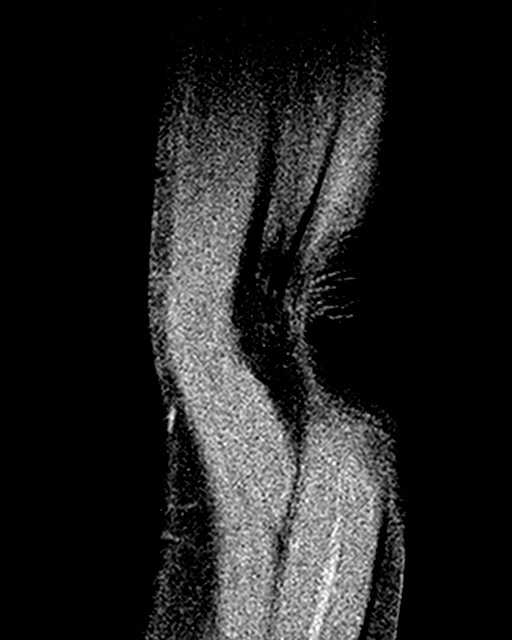
[im 8/20]
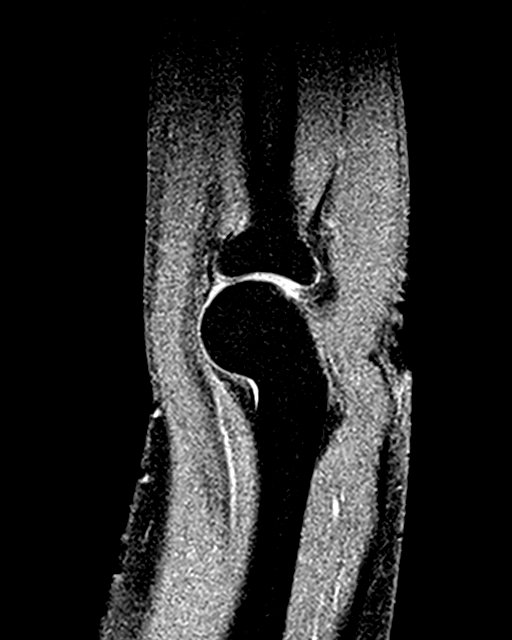
[im 10/20]
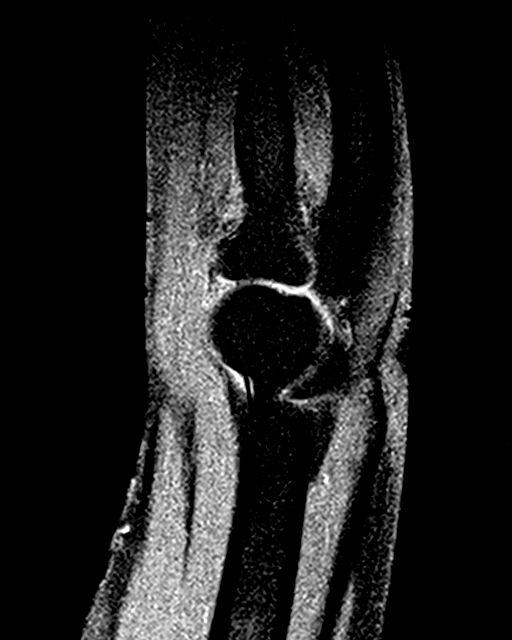
[im 12/20]
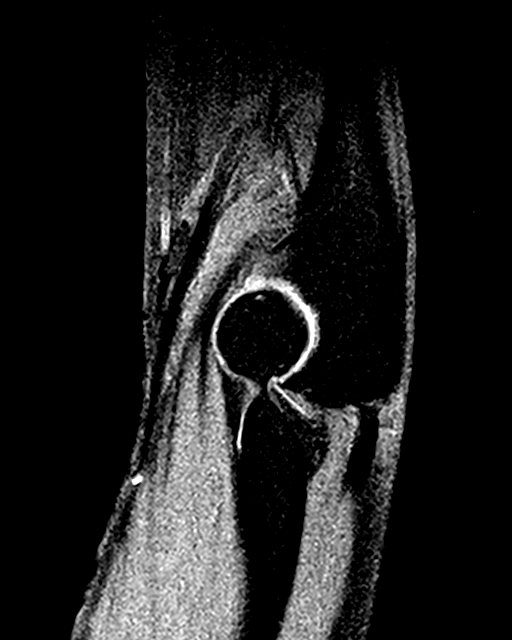
[im 15/20]
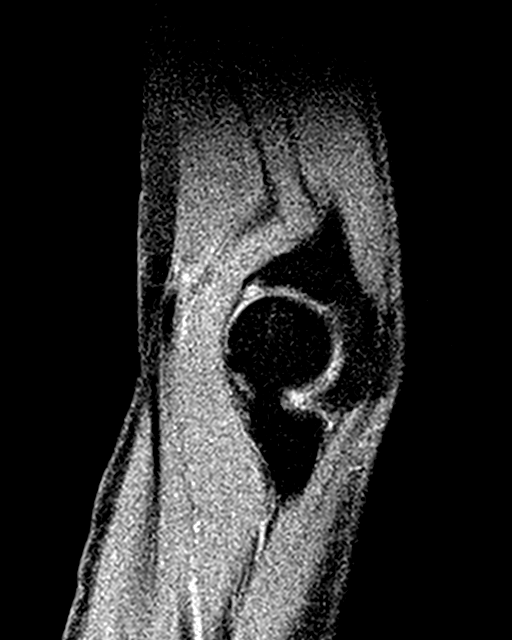
[im 17/20]
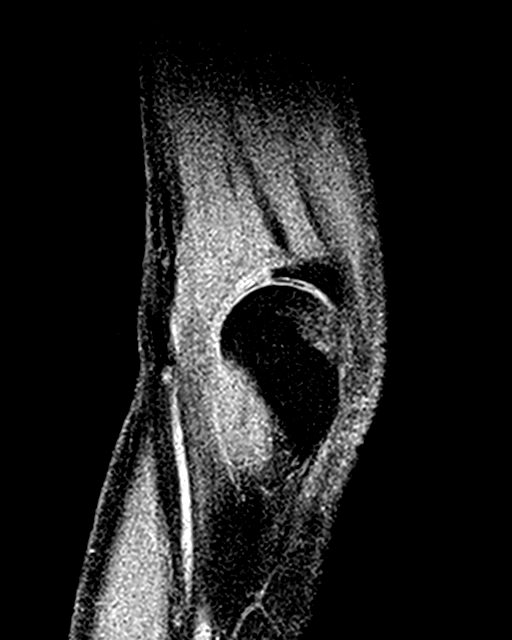

[Series 8: T2 fat-sat · coronal · 3.0mm · 0.27mm/px · 3 of 20 slices shown (2 of 2)]
[im 3/20]
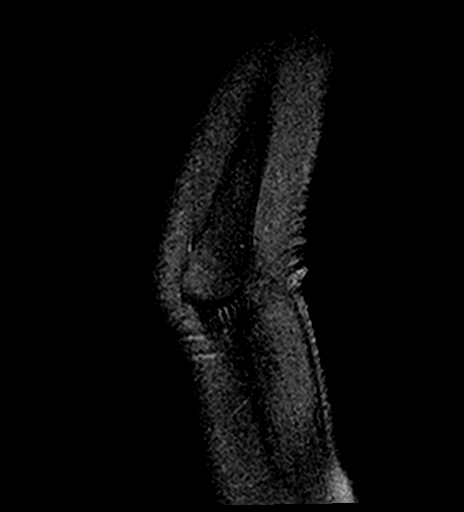
[im 10/20]
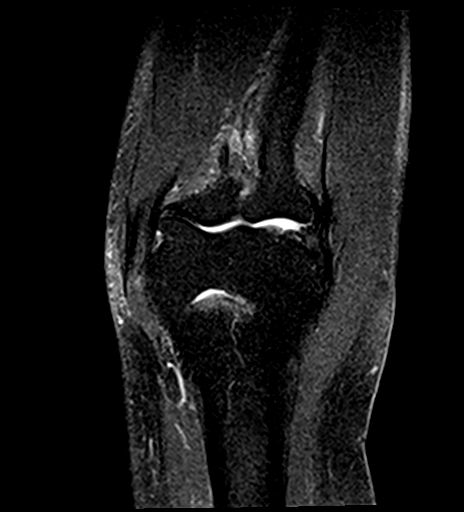
[im 17/20]
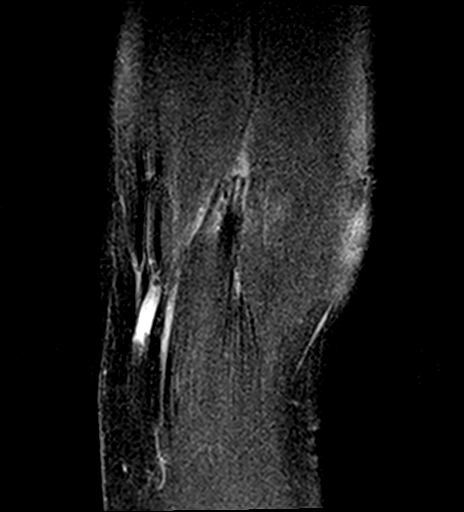

[17 of 40 positions shown; findings below may reference images not displayed]

FINDINGS: The course of the radial nerve is identified and appears normal.

TENDONS

Common forearm flexor origin: Normal.

Common forearm extensor origin: Normal.

Biceps: Normal.

Triceps: Normal.

LIGAMENTS

Medial stabilizers: Normal.

Lateral stabilizers:  Normal.

Cartilage: Normal.

Joint: Normal.

Cubital tunnel: Normal. The ulnar nerve appears normal in the
cubital tunnel.

Bones: Normal.
IMPRESSION: Normal exam.

## 2019-06-25 DIAGNOSIS — H2513 Age-related nuclear cataract, bilateral: Secondary | ICD-10-CM | POA: Diagnosis not present

## 2019-06-25 DIAGNOSIS — H40013 Open angle with borderline findings, low risk, bilateral: Secondary | ICD-10-CM | POA: Diagnosis not present

## 2019-06-25 DIAGNOSIS — H04123 Dry eye syndrome of bilateral lacrimal glands: Secondary | ICD-10-CM | POA: Diagnosis not present

## 2019-06-25 DIAGNOSIS — H25013 Cortical age-related cataract, bilateral: Secondary | ICD-10-CM | POA: Diagnosis not present

## 2019-07-02 ENCOUNTER — Other Ambulatory Visit: Payer: Self-pay

## 2019-07-03 ENCOUNTER — Ambulatory Visit (INDEPENDENT_AMBULATORY_CARE_PROVIDER_SITE_OTHER): Payer: Medicare HMO

## 2019-07-03 ENCOUNTER — Other Ambulatory Visit: Payer: Self-pay | Admitting: Gynecology

## 2019-07-03 DIAGNOSIS — M8589 Other specified disorders of bone density and structure, multiple sites: Secondary | ICD-10-CM

## 2019-07-03 DIAGNOSIS — M8580 Other specified disorders of bone density and structure, unspecified site: Secondary | ICD-10-CM

## 2019-07-03 DIAGNOSIS — Z780 Asymptomatic menopausal state: Secondary | ICD-10-CM

## 2019-07-08 ENCOUNTER — Encounter: Payer: Self-pay | Admitting: Gynecology

## 2019-07-08 ENCOUNTER — Telehealth: Payer: Self-pay | Admitting: Gynecology

## 2019-07-08 NOTE — Telephone Encounter (Signed)
Patient informed. 

## 2019-07-08 NOTE — Telephone Encounter (Signed)
Tell patient her bone density is stable from the prior study.  There is no further bone loss and she continues in the osteopenic range.  This is some loss of calcium from the bones but not to the extent of osteoporosis.  She does again have an increased calculated hip fracture risk over the next 10 years.  We discussed this previously and discussed medication options.  She declined medication at that time.  She is interested in re-discussing medication options and recommend office visit.  Otherwise I would recommend repeating the bone density in 2 years.

## 2019-07-30 ENCOUNTER — Encounter: Payer: Self-pay | Admitting: Gynecology

## 2019-07-30 DIAGNOSIS — Z1231 Encounter for screening mammogram for malignant neoplasm of breast: Secondary | ICD-10-CM | POA: Diagnosis not present

## 2019-09-04 ENCOUNTER — Encounter: Payer: Self-pay | Admitting: Gynecology

## 2019-09-04 DIAGNOSIS — Z23 Encounter for immunization: Secondary | ICD-10-CM | POA: Diagnosis not present

## 2019-09-17 DIAGNOSIS — S61213A Laceration without foreign body of left middle finger without damage to nail, initial encounter: Secondary | ICD-10-CM | POA: Diagnosis not present

## 2019-09-20 DIAGNOSIS — H00024 Hordeolum internum left upper eyelid: Secondary | ICD-10-CM | POA: Diagnosis not present

## 2019-09-20 DIAGNOSIS — H04123 Dry eye syndrome of bilateral lacrimal glands: Secondary | ICD-10-CM | POA: Diagnosis not present

## 2019-09-29 DIAGNOSIS — Z4802 Encounter for removal of sutures: Secondary | ICD-10-CM | POA: Diagnosis not present

## 2019-10-10 DIAGNOSIS — H00024 Hordeolum internum left upper eyelid: Secondary | ICD-10-CM | POA: Diagnosis not present

## 2019-11-28 DIAGNOSIS — M62838 Other muscle spasm: Secondary | ICD-10-CM | POA: Diagnosis not present

## 2019-12-26 ENCOUNTER — Ambulatory Visit: Payer: Commercial Managed Care - HMO

## 2020-01-03 ENCOUNTER — Ambulatory Visit: Payer: Medicare HMO | Attending: Internal Medicine

## 2020-01-03 DIAGNOSIS — Z23 Encounter for immunization: Secondary | ICD-10-CM | POA: Insufficient documentation

## 2020-01-03 NOTE — Progress Notes (Signed)
   Covid-19 Vaccination Clinic  Name:  Sandra Hall    MRN: [id number] DOB: 1946/06/09  01/03/2020  [name] was observed post Covid-19 immunization for 30 minutes based on pre-vaccination screening without incidence. She was provided with Vaccine Information Sheet and instruction to access the V-Safe system.   [name] was instructed to call 911 with any severe reactions post vaccine: Marland Kitchen Difficulty breathing  . Swelling of your face and throat  . A fast heartbeat  . A bad rash all over your body  . Dizziness and weakness    Immunizations Administered    Name Date Dose VIS Date Route   Pfizer COVID-19 Vaccine 01/03/2020  4:01 PM 0.3 mL 11/08/2019 Intramuscular   Manufacturer: [place]   Lot: [id number]   [place]: [id number]

## 2020-01-10 DIAGNOSIS — L718 Other rosacea: Secondary | ICD-10-CM | POA: Diagnosis not present

## 2020-01-10 DIAGNOSIS — L300 Nummular dermatitis: Secondary | ICD-10-CM | POA: Diagnosis not present

## 2020-01-29 ENCOUNTER — Ambulatory Visit: Payer: Medicare HMO | Attending: Internal Medicine

## 2020-01-29 DIAGNOSIS — Z23 Encounter for immunization: Secondary | ICD-10-CM | POA: Insufficient documentation

## 2020-01-29 NOTE — Progress Notes (Signed)
   Covid-19 Vaccination Clinic  Name:  DEALIE BALUYOT    MRN: [id number] DOB: 01-31-1946  01/29/2020  Ms. Schaad was observed post Covid-19 immunization for 15 minutes without incident. She was provided with Vaccine Information Sheet and instruction to access the V-Safe system.   Ms. Measel was instructed to call 911 with any severe reactions post vaccine: Marland Kitchen Difficulty breathing  . Swelling of face and throat  . A fast heartbeat  . A bad rash all over body  . Dizziness and weakness   Immunizations Administered    Name Date Dose VIS Date Route   Pfizer COVID-19 Vaccine 01/29/2020 12:45 PM 0.3 mL 11/08/2019 Intramuscular   Manufacturer: [place]   Lot: [id number]   [place]: [id number]

## 2020-03-02 DIAGNOSIS — H2513 Age-related nuclear cataract, bilateral: Secondary | ICD-10-CM | POA: Diagnosis not present

## 2020-03-02 DIAGNOSIS — H04123 Dry eye syndrome of bilateral lacrimal glands: Secondary | ICD-10-CM | POA: Diagnosis not present

## 2020-03-02 DIAGNOSIS — H25013 Cortical age-related cataract, bilateral: Secondary | ICD-10-CM | POA: Diagnosis not present

## 2020-03-02 DIAGNOSIS — H40013 Open angle with borderline findings, low risk, bilateral: Secondary | ICD-10-CM | POA: Diagnosis not present

## 2020-05-13 DIAGNOSIS — F419 Anxiety disorder, unspecified: Secondary | ICD-10-CM | POA: Diagnosis not present

## 2020-05-13 DIAGNOSIS — R7301 Impaired fasting glucose: Secondary | ICD-10-CM | POA: Diagnosis not present

## 2020-05-13 DIAGNOSIS — Z0000 Encounter for general adult medical examination without abnormal findings: Secondary | ICD-10-CM | POA: Diagnosis not present

## 2020-05-13 DIAGNOSIS — G4700 Insomnia, unspecified: Secondary | ICD-10-CM | POA: Diagnosis not present

## 2020-05-13 DIAGNOSIS — E7800 Pure hypercholesterolemia, unspecified: Secondary | ICD-10-CM | POA: Diagnosis not present

## 2020-05-13 DIAGNOSIS — Z131 Encounter for screening for diabetes mellitus: Secondary | ICD-10-CM | POA: Diagnosis not present

## 2020-06-12 ENCOUNTER — Encounter: Payer: Medicare HMO | Admitting: Gynecology

## 2020-06-12 ENCOUNTER — Encounter: Payer: Medicare HMO | Admitting: Obstetrics & Gynecology

## 2020-06-23 DIAGNOSIS — H40013 Open angle with borderline findings, low risk, bilateral: Secondary | ICD-10-CM | POA: Diagnosis not present

## 2020-06-26 DIAGNOSIS — L821 Other seborrheic keratosis: Secondary | ICD-10-CM | POA: Diagnosis not present

## 2020-06-26 DIAGNOSIS — L239 Allergic contact dermatitis, unspecified cause: Secondary | ICD-10-CM | POA: Diagnosis not present

## 2020-07-02 ENCOUNTER — Encounter: Payer: Self-pay | Admitting: Obstetrics & Gynecology

## 2020-07-02 ENCOUNTER — Other Ambulatory Visit: Payer: Self-pay

## 2020-07-02 ENCOUNTER — Ambulatory Visit (INDEPENDENT_AMBULATORY_CARE_PROVIDER_SITE_OTHER): Payer: Medicare HMO | Admitting: Obstetrics & Gynecology

## 2020-07-02 VITALS — BP 128/76 | Ht 63.5 in | Wt 125.4 lb

## 2020-07-02 DIAGNOSIS — Z01419 Encounter for gynecological examination (general) (routine) without abnormal findings: Secondary | ICD-10-CM

## 2020-07-02 DIAGNOSIS — Z780 Asymptomatic menopausal state: Secondary | ICD-10-CM

## 2020-07-02 DIAGNOSIS — Z9189 Other specified personal risk factors, not elsewhere classified: Secondary | ICD-10-CM | POA: Diagnosis not present

## 2020-07-02 DIAGNOSIS — M8580 Other specified disorders of bone density and structure, unspecified site: Secondary | ICD-10-CM

## 2020-07-02 DIAGNOSIS — Z90710 Acquired absence of both cervix and uterus: Secondary | ICD-10-CM

## 2020-07-02 NOTE — Progress Notes (Signed)
Sandra Hall Sep 21, 1946 [id number]   History:    74 y.o. G1P1L1 Married.  RP:  Established patient presenting for annual gyn exam   HPI: S/P TAH/RSO for Endometriosis.  Post-menopause, well on [place].  No Pelvic pain.  Abstinent.  Never had an abnormal Pap test.  Breasts normal.  Urine/BMs normal.  BMI 21.87.  Walking and working in the yard.  Health labs with Fam MD.  [name] due in 2022.  Past medical history,surgical history, family history and social history were all reviewed and documented in the EPIC chart.  Gynecologic History No LMP recorded. Patient has had a hysterectomy.  Obstetric History OB History  Gravida Para Term Preterm AB Living  1 1   1   1   SAB TAB Ectopic Multiple Live Births               # Outcome Date GA Lbr Len/2nd Weight Sex Delivery Anes PTL Lv  1 Preterm              ROS: A ROS was performed and pertinent positives and negatives are included in the history.  GENERAL: No fevers or chills. HEENT: No change in vision, no earache, sore throat or sinus congestion. NECK: No pain or stiffness. CARDIOVASCULAR: No chest pain or pressure. No palpitations. PULMONARY: No shortness of breath, cough or wheeze. GASTROINTESTINAL: No abdominal pain, nausea, vomiting or diarrhea, melena or bright red blood per rectum. GENITOURINARY: No urinary frequency, urgency, hesitancy or dysuria. MUSCULOSKELETAL: No joint or muscle pain, no back pain, no recent trauma. DERMATOLOGIC: No rash, no itching, no lesions. ENDOCRINE: No polyuria, polydipsia, no heat or cold intolerance. No recent change in weight. HEMATOLOGICAL: No anemia or easy bruising or bleeding. NEUROLOGIC: No headache, seizures, numbness, tingling or weakness. PSYCHIATRIC: No depression, no loss of interest in normal activity or change in sleep pattern.     Exam:   BP 128/76   Ht 5' 3.5" (1.613 m)   Wt 125 lb 6.4 oz (56.9 kg)   BMI 21.87 kg/m   Body mass index is 21.87 kg/m.  General appearance : Well  developed well nourished female. No acute distress HEENT: Eyes: no retinal hemorrhage or exudates,  Neck supple, trachea midline, no carotid bruits, no thyroidmegaly Lungs: Clear to auscultation, no rhonchi or wheezes, or rib retractions  Heart: Regular rate and rhythm, no murmurs or gallops Breast:Examined in sitting and supine position were symmetrical in appearance, no palpable masses or tenderness,  no skin retraction, no nipple inversion, no nipple discharge, no skin discoloration, no axillary or supraclavicular lymphadenopathy Abdomen: no palpable masses or tenderness, no rebound or guarding Extremities: no edema or skin discoloration or tenderness  Pelvic: Vulva: Normal             Vagina: No gross lesions or discharge  Cervix/Uterus absent  Adnexa  Without masses or tenderness  Anus: Normal   Assessment/Plan:  74 y.o. female for annual exam   1. Well female exam with routine gynecological exam Gyn exam s/p TAH/RSO.  No indication to repeat a Pap test.  Breasts normal.  Screening Mammo Neg 07/2019.  Colono due 2022.  Health labs with Fam MD.  BMI 21.87.  Continue with Fitness and healthy nutrition.  2. S/P TAH (total abdominal hysterectomy) with RSO  3. Postmenopause Well on [place].  4. Osteopenia, unspecified location Osteopenia on BD 06/2019.  Will repeat in 06/2021.  Vit D supplements/Ca++ 1200-1500 mg daily and regular weight bearing activities to continue.  [place]  Dellis Filbert MD, 12:44 PM 07/02/2020

## 2020-08-06 ENCOUNTER — Encounter: Payer: Self-pay | Admitting: Obstetrics & Gynecology

## 2020-08-06 DIAGNOSIS — Z1231 Encounter for screening mammogram for malignant neoplasm of breast: Secondary | ICD-10-CM | POA: Diagnosis not present

## 2020-08-10 DIAGNOSIS — R519 Headache, unspecified: Secondary | ICD-10-CM | POA: Diagnosis not present

## 2020-08-21 DIAGNOSIS — Z23 Encounter for immunization: Secondary | ICD-10-CM | POA: Diagnosis not present

## 2020-10-06 DIAGNOSIS — F4322 Adjustment disorder with anxiety: Secondary | ICD-10-CM | POA: Diagnosis not present

## 2020-11-06 DIAGNOSIS — F4322 Adjustment disorder with anxiety: Secondary | ICD-10-CM | POA: Diagnosis not present

## 2020-11-19 DIAGNOSIS — M62838 Other muscle spasm: Secondary | ICD-10-CM | POA: Diagnosis not present

## 2020-11-28 HISTORY — PX: CATARACT EXTRACTION: SUR2

## 2020-12-21 DIAGNOSIS — F4322 Adjustment disorder with anxiety: Secondary | ICD-10-CM | POA: Diagnosis not present

## 2021-02-04 DIAGNOSIS — J069 Acute upper respiratory infection, unspecified: Secondary | ICD-10-CM | POA: Diagnosis not present

## 2021-03-03 DIAGNOSIS — H40013 Open angle with borderline findings, low risk, bilateral: Secondary | ICD-10-CM | POA: Diagnosis not present

## 2021-03-03 DIAGNOSIS — H1045 Other chronic allergic conjunctivitis: Secondary | ICD-10-CM | POA: Diagnosis not present

## 2021-03-03 DIAGNOSIS — H2513 Age-related nuclear cataract, bilateral: Secondary | ICD-10-CM | POA: Diagnosis not present

## 2021-03-03 DIAGNOSIS — H04123 Dry eye syndrome of bilateral lacrimal glands: Secondary | ICD-10-CM | POA: Diagnosis not present

## 2021-03-30 ENCOUNTER — Encounter: Payer: Self-pay | Admitting: Allergy

## 2021-03-30 ENCOUNTER — Other Ambulatory Visit: Payer: Self-pay

## 2021-03-30 ENCOUNTER — Ambulatory Visit: Payer: Medicare HMO | Admitting: Allergy

## 2021-03-30 VITALS — BP 100/60 | HR 75 | Temp 98.8°F | Resp 16 | Ht 63.5 in | Wt 127.4 lb

## 2021-03-30 DIAGNOSIS — J310 Chronic rhinitis: Secondary | ICD-10-CM | POA: Diagnosis not present

## 2021-03-30 MED ORDER — IPRATROPIUM BROMIDE 0.03 % NA SOLN: 2.0000 | Freq: Two times a day (BID) | NASAL | 5 refills | Status: DC | PRN

## 2021-03-30 NOTE — Assessment & Plan Note (Signed)
Perennial rhinoconjunctivitis symptoms for 30 years with worsening in the spring and fall.  Tried over-the-counter antihistamines and Flonase with some benefit.  Skin testing in her 77s was positive to multiple items per patient report.  No prior allergy immunotherapy.  No recent ENT evaluation.  Today's skin testing showed: Negative to indoor/outdoor allergens.   May use ipratropium (Atrovent) nasal spray 1-2 sprays per nostril twice a day as needed for runny nose/drainage.  May use Flonase (fluticasone) nasal spray 1 spray per nostril twice a day as needed for nasal congestion.   Nasal saline spray (i.e., Simply Saline) or nasal saline lavage (i.e., NeilMed) is recommended as needed and prior to medicated nasal sprays.  Use after being outdoors for a long time to clean out sinuses.   May use over the counter antihistamines such as Zyrtec (cetirizine), Claritin (loratadine), Allegra (fexofenadine), or Xyzal (levocetirizine) daily as needed only.   Use restasis eye drops as needed.   Stop pataday eye drops for now as it can worsen dry eyes.

## 2021-03-30 NOTE — Patient Instructions (Addendum)
Today's skin testing showed: Negative to indoor/outdoor allergens.   Rhinitis:   May use ipratropium (atrovent) nasal spray 1-2 sprays per nostril twice a day as needed for runny nose/drainage.   May use Flonase (fluticasone) nasal spray 1 spray per nostril twice a day as needed for nasal congestion.    Nasal saline spray (i.e., Simply Saline) or nasal saline lavage (i.e., NeilMed) is recommended as needed and prior to medicated nasal sprays.  Use after being outdoors for a long time to clean out sinuses.    May use over the counter antihistamines such as Zyrtec (cetirizine), Claritin (loratadine), Allegra (fexofenadine), or Xyzal (levocetirizine) daily as needed.   Use restasis eye drops as needed.   Stop pataday eye drops for now.   Follow up in 3 months if needed.

## 2021-03-30 NOTE — Progress Notes (Signed)
New Patient Note  RE: Sandra Hall MRN: [id number] DOB: Jan 20, 1946 Date of Office Visit: 03/30/2021  Consult requested by: Verl Blalock Primary care provider: Joycelyn Rua, MD  Chief Complaint: Allergic Rhinitis  (Allergy symptoms spring, summer and fall. This year has been very bad. Was testing in early 30s )  History of Present Illness: I had the pleasure of seeing Sandra Hall for initial evaluation at the [facility] on 03/30/2021. She is a 75 y.o. female, who is referred here by Joycelyn Rua, MD for the evaluation of allergic rhinitis.  She reports symptoms of itchy/watery/burning eyes, coughing, sneezing, rhinorrhea, nasal congestion, PND. Symptoms have been going on for 30 years. The symptoms are present all year around with worsening in spring and fall. Other triggers include exposure to pollen. Anosmia: no. Headache: no. She has used allegra, zyrtec, Claritin, Benadryl, sudafed, Flonase with some improvement in symptoms. Sinus infections: no. Previous work up includes: skin testing in her 30s which showed multiple positives per patient report (trees, grass, weeds) and no prior allergy injections. Previous ENT evaluation: not recently. History of nasal polyps: no. Last eye exam: a few months ago Has dry eyes and uses restasis and recently prescribed pataday. History of reflux: takes Nexium 40mg  daily.  Assessment and Plan: Sandra Hall is a 75 y.o. female with: Chronic rhinitis Perennial rhinoconjunctivitis symptoms for 30 years with worsening in the spring and fall.  Tried over-the-counter antihistamines and Flonase with some benefit.  Skin testing in her 30s was positive to multiple items per patient report.  No prior allergy immunotherapy.  No recent ENT evaluation.  Today's skin testing showed: Negative to indoor/outdoor allergens.   May use ipratropium (Atrovent) nasal spray 1-2 sprays per nostril twice a day as needed for runny  nose/drainage.  May use Flonase (fluticasone) nasal spray 1 spray per nostril twice a day as needed for nasal congestion.   Nasal saline spray (i.e., Simply Saline) or nasal saline lavage (i.e., NeilMed) is recommended as needed and prior to medicated nasal sprays.  Use after being outdoors for a long time to clean out sinuses.   May use over the counter antihistamines such as Zyrtec (cetirizine), Claritin (loratadine), Allegra (fexofenadine), or Xyzal (levocetirizine) daily as needed only.   Use restasis eye drops as needed.   Stop pataday eye drops for now as it can worsen dry eyes.   Return in about 3 months (around 06/30/2021), or if symptoms worsen or fail to improve.  Meds ordered this encounter  Medications  . ipratropium (ATROVENT) 0.03 % nasal spray    Sig: Place 2 sprays into both nostrils 2 (two) times daily as needed (drainage).    Dispense:  30 mL    Refill:  5   Lab Orders  No laboratory test(s) ordered today    Other allergy screening: Asthma: no Food allergy: no Medication allergy: yes  Codeine - rash Hymenoptera allergy: no Urticaria: rarely Eczema:no History of recurrent infections suggestive of immunodeficency: no  Diagnostics: Skin Testing: Environmental allergy panel. Negative to indoor/outdoor allergens.  Results discussed with patient/family.  Airborne Adult Perc - 03/30/21 1500    Time Antigen Placed 1500    Allergen Manufacturer Greer    Location Back    Number of Test 59    1. Control-Buffer 50% Glycerol Negative    2. Control-Histamine 1 mg/ml 2+    3. Albumin saline Negative    4. Bahia Negative    5. French Southern Territories Negative  6. Johnson Negative    7. Cerulean Blue Negative    8. Meadow Fescue Negative    9. Perennial Rye Negative    10. Sweet Vernal Negative    11. Timothy Negative    12. Cocklebur Negative    13. Burweed Marshelder Negative    14. Ragweed, short Negative    15. Ragweed, Giant Negative    16. Plantain,  English  Negative    17. Lamb's Quarters Negative    18. Sheep Sorrell Negative    19. Rough Pigweed Negative    20. Marsh Elder, Rough Negative    21. Mugwort, Common Negative    22. Ash mix Negative    23. Birch mix Negative    24. Beech American Negative    25. Box, Elder Negative    26. Cedar, red Negative    27. Cottonwood, [place] Negative    28. Elm mix Negative    29. Hickory Negative    30. Maple mix Negative    31. Oak, [place] mix Negative    32. Pecan Pollen Negative    33. Pine mix Negative    34. Sycamore Eastern Negative    35. Coweta, Black Pollen Negative    36. Alternaria alternata Negative    37. Cladosporium Herbarum Negative    38. Aspergillus mix Negative    39. Penicillium mix Negative    40. Bipolaris sorokiniana (Helminthosporium) Negative    41. Drechslera spicifera (Curvularia) Negative    42. Mucor plumbeus Negative    43. Fusarium moniliforme Negative    44. Aureobasidium pullulans (pullulara) Negative    45. Rhizopus oryzae Negative    46. Botrytis cinera Negative    47. Epicoccum nigrum Negative    48. Phoma betae Negative    49. Candida Albicans Negative    50. Trichophyton mentagrophytes Negative    51. Mite, D Farinae  5,000 AU/ml Negative    52. Mite, D Pteronyssinus  5,000 AU/ml Negative    53. Cat Hair 10,000 BAU/ml Negative    54.  Dog Epithelia Negative    55. Mixed Feathers Negative    56. Horse Epithelia Negative    57. Cockroach, German Negative    58. Mouse Negative    59. Tobacco Leaf Negative          Intradermal - 03/30/21 1528    Time Antigen Placed 1528    Allergen Manufacturer Other    Location Arm    Number of Test 15    Control Negative    Guatemala Negative    Johnson Negative    7 Grass Negative    Ragweed mix Negative    Weed mix Negative    Tree mix Negative    Mold 1 Negative    Mold 2 Negative    Mold 3 Negative    Mold 4 Negative    Cat Negative    Dog Negative    Cockroach Negative    Mite mix Negative            Past Medical History: Patient Active Problem List   Diagnosis Date Noted  . Chronic rhinitis 03/30/2021  . Genetic testing 09/05/2016  . Family history of breast cancer in female 08/17/2016  . Family history of ovarian cancer 08/17/2016  . SCIATICA, LEFT 04/25/2008   Past Medical History:  Diagnosis Date  . Acid reflux   . Depression   . Insomnia   . Osteopenia 06/2019   T score -2.1 FRAX 19% /  7.9%  . PONV (postoperative nausea and vomiting)    severe nausea and vomiting  . Seasonal allergies   . Stress fracture of foot    right foot-over past year occ. intermittent pain   Past Surgical History: Past Surgical History:  Procedure Laterality Date  . ABDOMINAL HYSTERECTOMY     Hyst and RSO for endometriosis  . BACK SURGERY    . COLONOSCOPY WITH PROPOFOL N/A 09/07/2015   Procedure: COLONOSCOPY WITH PROPOFOL;  Surgeon: Garlan Fair, MD;  Location: [facility];  Service: Endoscopy;  Laterality: N/A;  . GANGLION CYST EXCISION Left    x2 left hand(1 elbow, 1 wrist)  . OOPHORECTOMY     RSO  . TONSILLECTOMY     Medication List:  Current Outpatient Medications  Medication Sig Dispense Refill  . BIOTIN PO Take 1 capsule by mouth daily.     . Calcium Carbonate-Vitamin D (CALCIUM + D PO) Take by mouth.    . clonazePAM (KLONOPIN) 0.5 MG tablet Take 0.25-0.5 mg by mouth 2 (two) times daily as needed for anxiety.    . Cyanocobalamin (VITAMIN B 12 PO) Take 1 tablet by mouth daily.     . cycloSPORINE (RESTASIS) 0.05 % ophthalmic emulsion 1 drop 2 (two) times daily.    Marland Kitchen esomeprazole (NEXIUM) 40 MG capsule Take 40 mg by mouth daily at 12 noon.    . fexofenadine (ALLEGRA) 180 MG tablet Take 180 mg by mouth daily.    . Glucosamine-Chondroitin (GLUCOSAMINE CHONDR COMPLEX PO) Take 1 tablet by mouth daily.    Marland Kitchen ipratropium (ATROVENT) 0.03 % nasal spray Place 2 sprays into both nostrils 2 (two) times daily as needed (drainage). 30 mL 5  . sertraline (ZOLOFT) 50 MG tablet  Take 50 mg by mouth daily.    Marland Kitchen VITAMIN E PO Take 1 capsule by mouth daily.    Marland Kitchen zolpidem (AMBIEN) 10 MG tablet Take 10 mg by mouth at bedtime as needed for sleep.     No current facility-administered medications for this visit.   Allergies: Allergies  Allergen Reactions  . Codeine Itching and Rash   Social History: Social History   Socioeconomic History  . Marital status: Married    Spouse name: Not on file  . Number of children: Not on file  . Years of education: Not on file  . Highest education level: Not on file  Occupational History  . Not on file  Tobacco Use  . Smoking status: Former Smoker    Years: 35.00    Types: Cigarettes    Quit date: 08/21/1995    Years since quitting: 25.6  . Smokeless tobacco: Never Used  . Tobacco comment: "casual smoker" - maybe smoked 2 pks per yr  Vaping Use  . Vaping Use: Never used  Substance and Sexual Activity  . Alcohol use: Yes    Alcohol/week: 1.0 standard drink    Types: 1 Standard drinks or equivalent per week    Comment: WINE -social  . Drug use: No  . Sexual activity: Never    Comment: 1st intercourse 75 yo-More than 5 partners  Other Topics Concern  . Not on file  Social History Narrative  . Not on file   Social Determinants of Health   Financial Resource Strain: Not on file  Food Insecurity: Not on file  Transportation Needs: Not on file  Physical Activity: Not on file  Stress: Not on file  Social Connections: Not on file   Lives in a house which is  74 years old. Smoking: denies Occupation: retired  Programme researcher, broadcasting/film/video HistoryFreight forwarder in the house: no Charity fundraiser in the family room: no Carpet in the bedroom: yes Heating: gas Cooling: central Pet: no  Family History: Family History  Problem Relation Age of Onset  . Diabetes Mother   . Other Mother        asbestosis in lungs (husband worked around asbestos)  . Lung cancer Father 53       worked in Sales executive, around [employer]; former smoker  .  Diabetes Sister   . Other Sister        hx of hysterectomy for "femal issues"  . Kidney disease Sister   . Heart attack Sister   . Ovarian cancer Maternal Aunt        dx 58-59  . Lung cancer Paternal Uncle        d. 53; smoker; worked around asbestos  . Diabetes Maternal Grandmother   . Heart attack Maternal Grandmother 85  . Emphysema Maternal Grandfather        d. late 60s-70  . Dementia Paternal Grandmother        d. 56-83  . Heart Problems Paternal Grandfather        d. early 52s  . Other Sister 43       hx of hysterectomy for non-cancerous mass in uterus  . ALS Maternal Aunt        d. 29; no other family members known to be affected  . Stroke Maternal Aunt 90  . Alzheimer's disease Maternal Aunt        d. 64  . Breast cancer Maternal Aunt        dx 83-84  . Breast cancer Cousin 12       "rare type"; s/p bilateral mastectomies  . Heart Problems Paternal Uncle   . Heart Problems Paternal Uncle   . Heart Problems Paternal Uncle   . Allergic rhinitis Neg Hx   . Angioedema Neg Hx   . Asthma Neg Hx   . Atopy Neg Hx   . Eczema Neg Hx   . Urticaria Neg Hx    Review of Systems  Constitutional: Negative for appetite change, chills, fever and unexpected weight change.  HENT: Positive for congestion, postnasal drip, rhinorrhea and sneezing.   Eyes: Positive for itching.  Respiratory: Positive for cough. Negative for chest tightness, shortness of breath and wheezing.   Cardiovascular: Negative for chest pain.  Gastrointestinal: Negative for abdominal pain.  Genitourinary: Negative for difficulty urinating.  Skin: Negative for rash.  Allergic/Immunologic: Negative for environmental allergies.  Neurological: Negative for headaches.   Objective: BP 100/60   Pulse 75   Temp 98.8 F (37.1 C) (Temporal)   Resp 16   Ht 5' 3.5" (1.613 m)   Wt 127 lb 6.4 oz (57.8 kg)   SpO2 98%   BMI 22.21 kg/m  Body mass index is 22.21 kg/m. Physical Exam Vitals and nursing note  reviewed.  Constitutional:      Appearance: Normal appearance. She is well-developed.  HENT:     Head: Normocephalic and atraumatic.     Right Ear: External ear normal.     Left Ear: External ear normal.     Nose: Nose normal.     Mouth/Throat:     Mouth: Mucous membranes are moist.     Pharynx: Oropharynx is clear.  Eyes:     Conjunctiva/sclera: Conjunctivae normal.  Cardiovascular:     Rate and Rhythm: Normal rate and regular  rhythm.     Heart sounds: Normal heart sounds. No murmur heard. No friction rub. No gallop.   Pulmonary:     Effort: Pulmonary effort is normal.     Breath sounds: Normal breath sounds. No wheezing, rhonchi or rales.  Abdominal:     Palpations: Abdomen is soft.  Musculoskeletal:     Cervical back: Neck supple.  Skin:    General: Skin is warm.     Findings: No rash.  Neurological:     Mental Status: She is alert and oriented to person, place, and time.  Psychiatric:        Behavior: Behavior normal.    The plan was reviewed with the patient/family, and all questions/concerned were addressed.  It was my pleasure to see Sandra Hall today and participate in her care. Please feel free to contact me with any questions or concerns.  Sincerely,  Rexene Alberts, DO Allergy & Immunology  [facility] office: [place] office: [phone]

## 2021-05-14 DIAGNOSIS — E7800 Pure hypercholesterolemia, unspecified: Secondary | ICD-10-CM | POA: Diagnosis not present

## 2021-05-14 DIAGNOSIS — Z0000 Encounter for general adult medical examination without abnormal findings: Secondary | ICD-10-CM | POA: Diagnosis not present

## 2021-05-14 DIAGNOSIS — R7303 Prediabetes: Secondary | ICD-10-CM | POA: Diagnosis not present

## 2021-05-14 DIAGNOSIS — Z23 Encounter for immunization: Secondary | ICD-10-CM | POA: Diagnosis not present

## 2021-05-14 DIAGNOSIS — G4700 Insomnia, unspecified: Secondary | ICD-10-CM | POA: Diagnosis not present

## 2021-05-14 DIAGNOSIS — F419 Anxiety disorder, unspecified: Secondary | ICD-10-CM | POA: Diagnosis not present

## 2021-05-18 DIAGNOSIS — H16223 Keratoconjunctivitis sicca, not specified as Sjogren's, bilateral: Secondary | ICD-10-CM | POA: Diagnosis not present

## 2021-05-18 DIAGNOSIS — H04123 Dry eye syndrome of bilateral lacrimal glands: Secondary | ICD-10-CM | POA: Diagnosis not present

## 2021-05-18 DIAGNOSIS — H43813 Vitreous degeneration, bilateral: Secondary | ICD-10-CM | POA: Diagnosis not present

## 2021-07-01 ENCOUNTER — Ambulatory Visit: Payer: Medicare HMO | Admitting: Allergy

## 2021-07-07 ENCOUNTER — Encounter: Payer: Self-pay | Admitting: Obstetrics & Gynecology

## 2021-07-07 ENCOUNTER — Ambulatory Visit (INDEPENDENT_AMBULATORY_CARE_PROVIDER_SITE_OTHER): Payer: Medicare HMO | Admitting: Obstetrics & Gynecology

## 2021-07-07 ENCOUNTER — Other Ambulatory Visit: Payer: Self-pay

## 2021-07-07 VITALS — BP 114/70 | HR 79 | Resp 16 | Ht 63.25 in | Wt 124.0 lb

## 2021-07-07 DIAGNOSIS — M810 Age-related osteoporosis without current pathological fracture: Secondary | ICD-10-CM

## 2021-07-07 DIAGNOSIS — Z01419 Encounter for gynecological examination (general) (routine) without abnormal findings: Secondary | ICD-10-CM

## 2021-07-07 DIAGNOSIS — Z90710 Acquired absence of both cervix and uterus: Secondary | ICD-10-CM

## 2021-07-07 DIAGNOSIS — Z9189 Other specified personal risk factors, not elsewhere classified: Secondary | ICD-10-CM | POA: Diagnosis not present

## 2021-07-07 DIAGNOSIS — M8580 Other specified disorders of bone density and structure, unspecified site: Secondary | ICD-10-CM

## 2021-07-07 DIAGNOSIS — Z780 Asymptomatic menopausal state: Secondary | ICD-10-CM | POA: Diagnosis not present

## 2021-07-07 NOTE — Progress Notes (Addendum)
Sandra Hall 1946/10/14 [id number]   History:    75 y.o.  G1P1L1 Married.   RP:  Established patient presenting for annual gyn exam   HPI: S/P TAH/RSO for Endometriosis.  Post-menopause, well on Estroven.  No Pelvic pain.  Abstinent.  Never had an abnormal Pap test.  Breasts normal.  Urine/BMs normal.  BMI 21.79.  Walking, Tai-Chi and working in the yard.  Health labs with Fam MD.  Sandra Hall 2022.    Past medical history,surgical history, family history and social history were all reviewed and documented in the EPIC chart.  Gynecologic History No LMP recorded. Patient has had a hysterectomy.   Obstetric History OB History  Gravida Para Term Preterm AB Living  _0 SAB IAB Ectopic Multiple Live Births               # Outcome Date GA Lbr Len/2nd Weight Sex Delivery Anes PTL Lv  1 Preterm              ROS: A ROS was performed and pertinent positives and negatives are included in the history.  GENERAL: No fevers or chills. HEENT: No change in vision, no earache, sore throat or sinus congestion. NECK: No pain or stiffness. CARDIOVASCULAR: No chest pain or pressure. No palpitations. PULMONARY: No shortness of breath, cough or wheeze. GASTROINTESTINAL: No abdominal pain, nausea, vomiting or diarrhea, melena or bright red blood per rectum. GENITOURINARY: No urinary frequency, urgency, hesitancy or dysuria. MUSCULOSKELETAL: No joint or muscle pain, no back pain, no recent trauma. DERMATOLOGIC: No rash, no itching, no lesions. ENDOCRINE: No polyuria, polydipsia, no heat or cold intolerance. No recent change in weight. HEMATOLOGICAL: No anemia or easy bruising or bleeding. NEUROLOGIC: No headache, seizures, numbness, tingling or weakness. PSYCHIATRIC: No depression, no loss of interest in normal activity or change in sleep pattern.     Exam:   BP 114/70   Pulse 79   Resp 16   Ht 5' 3.25" (1.607 m)   Wt 124 lb (56.2 kg)   BMI 21.79 kg/m   Body mass index is 21.79  kg/m.  General appearance : Well developed well nourished female. No acute distress HEENT: Eyes: no retinal hemorrhage or exudates,  Neck supple, trachea midline, no carotid bruits, no thyroidmegaly Lungs: Clear to auscultation, no rhonchi or wheezes, or rib retractions  Heart: Regular rate and rhythm, no murmurs or gallops Breast:Examined in sitting and supine position were symmetrical in appearance, no palpable masses or tenderness,  no skin retraction, no nipple inversion, no nipple discharge, no skin discoloration, no axillary or supraclavicular lymphadenopathy Abdomen: no palpable masses or tenderness, no rebound or guarding Extremities: no edema or skin discoloration or tenderness  Pelvic: Vulva: Normal             Vagina: No gross lesions or discharge  Cervix/Uterus absent  Adnexa  Without masses or tenderness  Anus: Normal   Assessment/Plan:  75 y.o. female for annual exam   1. Well female exam with routine gynecological exam Gynecologic exam s/p TAH/RSO.  No indication for a Pap test at this time.  Breast exam normal.  Screening mammo 07/2020 Neg.  [place] 2019.  BMI 21.79.  2. At risk of fracture due to osteopenia   3. S/P TAH (total abdominal hysterectomy)  4. Postmenopause Well on no systemic HRT.  Taking Estroven.   - DG Bone Density; Future  5. Osteopenia, unspecified location Schedule BD here now.  Vit  D Supplements, Ca++ 1.5 g/d total, continue regular weight bearing physical activities. - DG Bone Density; Future  Other orders - sertraline (ZOLOFT) 100 MG tablet   Sandra Bruins MD, 12:18 PM 07/07/2021

## 2021-08-17 DIAGNOSIS — Z1231 Encounter for screening mammogram for malignant neoplasm of breast: Secondary | ICD-10-CM | POA: Diagnosis not present

## 2021-08-19 ENCOUNTER — Encounter: Payer: Self-pay | Admitting: Obstetrics & Gynecology

## 2021-08-24 ENCOUNTER — Other Ambulatory Visit: Payer: Self-pay

## 2021-08-24 ENCOUNTER — Ambulatory Visit (INDEPENDENT_AMBULATORY_CARE_PROVIDER_SITE_OTHER): Payer: Medicare HMO

## 2021-08-24 ENCOUNTER — Other Ambulatory Visit: Payer: Self-pay | Admitting: Obstetrics & Gynecology

## 2021-08-24 DIAGNOSIS — Z780 Asymptomatic menopausal state: Secondary | ICD-10-CM

## 2021-08-24 DIAGNOSIS — M8589 Other specified disorders of bone density and structure, multiple sites: Secondary | ICD-10-CM

## 2021-08-24 DIAGNOSIS — M8580 Other specified disorders of bone density and structure, unspecified site: Secondary | ICD-10-CM

## 2021-09-27 DIAGNOSIS — H524 Presbyopia: Secondary | ICD-10-CM | POA: Diagnosis not present

## 2021-09-27 DIAGNOSIS — H40013 Open angle with borderline findings, low risk, bilateral: Secondary | ICD-10-CM | POA: Diagnosis not present

## 2021-09-27 DIAGNOSIS — H2513 Age-related nuclear cataract, bilateral: Secondary | ICD-10-CM | POA: Diagnosis not present

## 2021-09-27 DIAGNOSIS — H43813 Vitreous degeneration, bilateral: Secondary | ICD-10-CM | POA: Diagnosis not present

## 2021-09-27 DIAGNOSIS — H25013 Cortical age-related cataract, bilateral: Secondary | ICD-10-CM | POA: Diagnosis not present

## 2021-10-12 DIAGNOSIS — H2511 Age-related nuclear cataract, right eye: Secondary | ICD-10-CM | POA: Diagnosis not present

## 2021-10-12 DIAGNOSIS — H25811 Combined forms of age-related cataract, right eye: Secondary | ICD-10-CM | POA: Diagnosis not present

## 2021-10-18 ENCOUNTER — Encounter: Payer: Self-pay | Admitting: Obstetrics & Gynecology

## 2021-10-18 ENCOUNTER — Other Ambulatory Visit: Payer: Self-pay

## 2021-10-18 ENCOUNTER — Ambulatory Visit: Payer: Medicare HMO | Admitting: Obstetrics & Gynecology

## 2021-10-18 VITALS — BP 110/68

## 2021-10-18 DIAGNOSIS — Z9189 Other specified personal risk factors, not elsewhere classified: Secondary | ICD-10-CM | POA: Diagnosis not present

## 2021-10-18 DIAGNOSIS — M8580 Other specified disorders of bone density and structure, unspecified site: Secondary | ICD-10-CM | POA: Diagnosis not present

## 2021-10-18 DIAGNOSIS — M810 Age-related osteoporosis without current pathological fracture: Secondary | ICD-10-CM

## 2021-10-18 NOTE — Progress Notes (Signed)
    Sandra Hall 10/28/1946 [id number]        75 y.o.  G1P0101   RP: Counseling on management of Osteopenia with increased Frax  HPI: Bone density 08/24/2021 Osteopenia at all sites, lowest at Rt Femoral Neck T-Score -2.2 with Frax increased at 23 % overall 10 yr risk and hip 13% 10 yr risk.  Takes Vit D supplement, Ca++ 1.4 g/d and walks and does weight lifting regularly.  Good balance.  No h/o fracture.   OB History  Gravida Para Term Preterm AB Living  1 1   1   1   SAB IAB Ectopic Multiple Live Births               # Outcome Date GA Lbr Len/2nd Weight Sex Delivery Anes PTL Lv  1 Preterm             Past medical history,surgical history, problem list, medications, allergies, family history and social history were all reviewed and documented in the EPIC chart.   Directed ROS with pertinent positives and negatives documented in the history of present illness/assessment and plan.  Exam:  Vitals:   10/18/21 1523  BP: 110/68   General appearance:  Normal  Bone Density: Bone density 08/24/2021 Osteopenia at all sites, lowest at Rt Femoral Neck T-Score -2.2 with Frax increased at 23 % overall 10 yr risk and hip 13% 10 yr risk.     Assessment/Plan:  75 y.o. G1P0101   1. Osteopenia with increased Frax Bone density 08/24/2021 Osteopenia at all sites, lowest at Rt Femoral Neck T-Score -2.2 with Frax increased at 23 % overall 10 yr risk and hip 13% 10 yr risk. Bone density was improved at the AP Spine and stable at the Total Hips. Takes Vit D supplement, Ca++ 1.4 g/d and walks and does weight lifting regularly.  Good balance.  No h/o fracture. Decision to continue to optimize Vit D, Ca++ and weight bearing physical activity.  Counseling on Bone Medication done, especially Bisphosphonates and Prolia. Patient declines Bone Medication at this time.  Repeat BD in 2 years.  2. At risk of fracture due to osteopenia with increased Frax Counseling done as above.  Other orders - fluticasone  (FLONASE) 50 MCG/ACT nasal spray; SPRAY 2 SPRAYS INTO EACH NOSTRIL EVERY DAY - metroNIDAZOLE (METROCREAM) 0.75 % cream; Apply 1 application topically 2 (two) times daily. - UNABLE TO FIND; Med Name: Marquita Palms MD, 3:29 PM 10/18/2021

## 2021-11-10 DIAGNOSIS — H25012 Cortical age-related cataract, left eye: Secondary | ICD-10-CM | POA: Diagnosis not present

## 2021-11-10 DIAGNOSIS — H2512 Age-related nuclear cataract, left eye: Secondary | ICD-10-CM | POA: Diagnosis not present

## 2021-11-16 DIAGNOSIS — H25812 Combined forms of age-related cataract, left eye: Secondary | ICD-10-CM | POA: Diagnosis not present

## 2021-11-16 DIAGNOSIS — H2512 Age-related nuclear cataract, left eye: Secondary | ICD-10-CM | POA: Diagnosis not present

## 2021-11-16 DIAGNOSIS — H25012 Cortical age-related cataract, left eye: Secondary | ICD-10-CM | POA: Diagnosis not present

## 2021-11-23 DIAGNOSIS — M940 Chondrocostal junction syndrome [Tietze]: Secondary | ICD-10-CM | POA: Diagnosis not present

## 2021-12-22 DIAGNOSIS — R059 Cough, unspecified: Secondary | ICD-10-CM | POA: Diagnosis not present

## 2021-12-27 ENCOUNTER — Other Ambulatory Visit: Payer: Self-pay

## 2021-12-27 ENCOUNTER — Ambulatory Visit: Payer: Medicare HMO | Admitting: Podiatry

## 2021-12-27 DIAGNOSIS — M79675 Pain in left toe(s): Secondary | ICD-10-CM

## 2021-12-27 DIAGNOSIS — B351 Tinea unguium: Secondary | ICD-10-CM

## 2021-12-27 DIAGNOSIS — L600 Ingrowing nail: Secondary | ICD-10-CM | POA: Diagnosis not present

## 2021-12-27 DIAGNOSIS — M79674 Pain in right toe(s): Secondary | ICD-10-CM

## 2021-12-27 MED ORDER — EFINACONAZOLE 10 % EX SOLN: 1.0000 [drp] | Freq: Every day | CUTANEOUS | 11 refills | Status: DC

## 2021-12-27 NOTE — Patient Instructions (Signed)
Soak Instructions    THE DAY AFTER THE PROCEDURE  Place 1/4 cup of epsom salts in a quart of warm tap water.  Submerge your foot or feet with outer bandage intact for the initial soak; this will allow the bandage to become moist and wet for easy lift off.  Once you remove your bandage, continue to soak in the solution for 20 minutes.  This soak should be done twice a day.  Next, remove your foot or feet from solution, blot dry the affected area and cover.  You may use a band aid large enough to cover the area or use gauze and tape.  Apply other medications to the area as directed by the doctor such as polysporin neosporin.  IF YOUR SKIN BECOMES IRRITATED WHILE USING THESE INSTRUCTIONS, IT IS OKAY TO SWITCH TO  WHITE VINEGAR AND WATER. Or you may use antibacterial soap and water to keep the toe clean  Monitor for any signs/symptoms of infection. Call the office immediately if any occur or go directly to the emergency room. Call with any questions/concerns.   Efinaconazole Topical Solution What is this medication? EFINACONAZOLE (e FEE na KON a zole) is an antifungal medicine. It is used to treat certain kinds of fungal infections of the toenail. This medicine may be used for other purposes; ask your health care provider or pharmacist if you have questions. COMMON BRAND NAME(S): JUBLIA What should I tell my care team before I take this medication? They need to know if you have any of these conditions: an unusual or allergic reaction to efinaconazole, other medicines, foods, dyes or preservatives pregnant or trying to get pregnant breast-feeding How should I use this medication? This medicine is for external use only. Do not take by mouth. Follow the directions on the label. Wash hands before and after use. Apply this medicine using the provided brush to cover the entire toenail. Do not use your medicine more often than directed. Finish the full course prescribed by your doctor or health care  professional even if you think your condition is better. Do not stop using except on the advice of your doctor or health care professional. Talk to your pediatrician regarding the use of this medicine in children. While this drug may be prescribed for children as young as 6 years for selected conditions, precautions do apply. Overdosage: If you think you have taken too much of this medicine contact a poison control center or emergency room at once. NOTE: This medicine is only for you. Do not share this medicine with others. What if I miss a dose? If you miss a dose, use it as soon as you can. If it is almost time for your next dose, use only that dose. Do not use double or extra doses. What may interact with this medication? Interactions have not been studied. Do not use any other nail products (i.e., nail polish, pedicures) during treatment with this medicine. This list may not describe all possible interactions. Give your health care provider a list of all the medicines, herbs, non-prescription drugs, or dietary supplements you use. Also tell them if you smoke, drink alcohol, or use illegal drugs. Some items may interact with your medicine. What should I watch for while using this medication? Do not get this medicine in your eyes. If you do, rinse out with plenty of cool tap water. Tell your doctor or health care professional if your symptoms do not start to get better or if they get worse. Wait for  at least 10 minutes after bathing before applying this medication. After bathing, make sure that your feet are very dry. Fungal infections like moist conditions. Do not walk around barefoot. To help prevent reinfection, wear freshly washed cotton, not synthetic clothing. Tell your doctor or health care professional if you develop sores or blisters that do not heal properly. If your nail infection returns after you stop using this medicine, contact your doctor or health care professional. What side effects  may I notice from receiving this medication? Side effects that you should report to your doctor or health care professional as soon as possible: allergic reactions like skin rash, itching or hives, swelling of the face, lips, or tongue ingrown toenail Side effects that usually do not require medical attention (report to your doctor or health care professional if they continue or are bothersome): mild skin irritation, burning, or itching This list may not describe all possible side effects. Call your doctor for medical advice about side effects. You may report side effects to FDA at 1-800-FDA-1088. Where should I keep my medication? Keep out of the reach of children. Store at room temperature between 20 and 25 degrees C (68 and 77 degrees F). Keep this medicine in the original container. Throw away any unused medicine after the expiration date. This medicine is flammable. Avoid exposure to heat, fire, flame, and smoking. NOTE: This sheet is a summary. It may not cover all possible information. If you have questions about this medicine, talk to your doctor, pharmacist, or health care provider.  2022 Elsevier/Gold Standard (2019-03-27 00:00:00)

## 2021-12-29 NOTE — Progress Notes (Signed)
Subjective:   Patient ID: Sandra Hall, female   DOB: 76 y.o.   MRN: [id number]   HPI 76 year old female presents the office today for concerns of pain to her left big toe.  She states that about 2 months ago she was getting a pedicure and she noticed a yellow, black spot the nail was hurting.  She is also concerned about the fourth toenail as it is getting tender at times.  She said the pain is intermittent.  She said that she started cleaning with vinegar and she has been using argon oil and she said the darkness has resolved however the nails are still tender and she has noticed some yellow discoloration.  Also has secondary concerns of ingrown toenails and was discussed different treatment options for this.  Currently denies any swelling or redness to the toenail sites.  No other concerns.   Review of Systems  All other systems reviewed and are negative.  Past Medical History:  Diagnosis Date   Acid reflux    Depression    Insomnia    Osteopenia 06/2019   T score -2.1 FRAX 19% / 7.9%   PONV (postoperative nausea and vomiting)    severe nausea and vomiting   Seasonal allergies    Stress fracture of foot    right foot-over past year occ. intermittent pain    Past Surgical History:  Procedure Laterality Date   ABDOMINAL HYSTERECTOMY     Hyst and RSO for endometriosis   BACK SURGERY     CATARACT EXTRACTION Right 2022   COLONOSCOPY WITH PROPOFOL N/A 09/07/2015   Procedure: COLONOSCOPY WITH PROPOFOL;  Surgeon: Garlan Fair, MD;  Location: [facility];  Service: Endoscopy;  Laterality: N/A;   GANGLION CYST EXCISION Left    x2 left hand(1 elbow, 1 wrist)   OOPHORECTOMY     RSO   TONSILLECTOMY       Current Outpatient Medications:    Efinaconazole 10 % SOLN, Apply 1 drop topically daily., Disp: 4 mL, Rfl: 11   BIOTIN PO, Take 1 capsule by mouth daily. , Disp: , Rfl:    Calcium Carbonate-Vitamin D (CALCIUM + D PO), Take by mouth., Disp: , Rfl:    clonazePAM (KLONOPIN) 0.5  MG tablet, Take 0.25-0.5 mg by mouth 2 (two) times daily as needed for anxiety., Disp: , Rfl:    Cyanocobalamin (VITAMIN B 12 PO), Take 1 tablet by mouth daily. , Disp: , Rfl:    cycloSPORINE (RESTASIS) 0.05 % ophthalmic emulsion, 1 drop 2 (two) times daily., Disp: , Rfl:    esomeprazole (NEXIUM) 40 MG capsule, Take 40 mg by mouth daily at 12 noon., Disp: , Rfl:    fluticasone (FLONASE) 50 MCG/ACT nasal spray, SPRAY 2 SPRAYS INTO EACH NOSTRIL EVERY DAY, Disp: , Rfl:    Glucosamine-Chondroitin (GLUCOSAMINE CHONDR COMPLEX PO), Take 1 tablet by mouth daily., Disp: , Rfl:    ipratropium (ATROVENT) 0.03 % nasal spray, Place 2 sprays into both nostrils 2 (two) times daily as needed (drainage)., Disp: 30 mL, Rfl: 5   metroNIDAZOLE (METROCREAM) 0.75 % cream, Apply 1 application topically 2 (two) times daily., Disp: , Rfl:    sertraline (ZOLOFT) 100 MG tablet, , Disp: , Rfl:    UNABLE TO FIND, Med Name: estroven, Disp: , Rfl:    VITAMIN E PO, Take 1 capsule by mouth daily., Disp: , Rfl:    zolpidem (AMBIEN) 10 MG tablet, Take 10 mg by mouth at bedtime as needed for sleep., Disp: ,  Rfl:   Allergies  Allergen Reactions   Codeine Itching and Rash          Objective:  Physical Exam  General: AAO x3, NAD  Dermatological: Incurvation present to bilateral hallux.  No edema, erythema.  Tenderness palpation.  Left second digit toenail with mild yellow discoloration at the distal aspect.  Also minimally to the left fourth toes and nail somewhat thickened.  There is no drainage or pus.  There is no hyperpigmentation to the toenail. No open lesions.   Vascular: Dorsalis Pedis artery and Posterior Tibial artery pedal pulses are 2/4 bilateral with immedate capillary fill time. There is no pain with calf compression, swelling, warmth, erythema.   Neruologic: Grossly intact via light touch bilateral.   Musculoskeletal: Tenderness of ingrown toenails.  No other areas of tenderness noted.  Muscular strength 5/5  in all groups tested bilateral.  Gait: Unassisted, Nonantalgic.       Assessment:   Ingrown toenails, onychomycosis    Plan:  -Treatment options discussed including all alternatives, risks, and complications -Etiology of symptoms were discussed -In regards to ingrown toenails we discussed partial nail avulsions with chemical matricectomy.  We discussed the surgery as well as postoperative course and she was to proceed with this.  I will schedule at her convenience for this. -Regards to nail fungus prescribe Jublia. -As a courtesy debrided the symptomatic nails to any complications or bleeding.  Trula Slade DPM

## 2022-01-24 ENCOUNTER — Other Ambulatory Visit: Payer: Self-pay

## 2022-01-24 ENCOUNTER — Ambulatory Visit: Payer: Medicare HMO | Admitting: Podiatry

## 2022-01-24 DIAGNOSIS — L600 Ingrowing nail: Secondary | ICD-10-CM

## 2022-01-24 MED ORDER — CEPHALEXIN 500 MG PO CAPS: 500.0000 mg | ORAL_CAPSULE | Freq: Three times a day (TID) | ORAL | 0 refills | Status: AC

## 2022-01-30 DIAGNOSIS — L600 Ingrowing nail: Secondary | ICD-10-CM | POA: Insufficient documentation

## 2022-01-30 NOTE — Progress Notes (Signed)
Subjective: 76 year old female presents the office today with concerns of ingrown toenails of both big toes, medial aspect.  They are causing discomfort.  Going on the corners removed.  Currently denies any drainage or pus coming from the areas.  No other concerns.  Objective: AAO x3, NAD DP/PT pulses palpable bilaterally, CRT less than 3 seconds Incurvation present to medial aspect of bilateral hallux toenails with minimal edema.  No cellulitis, drainage or pus noted today.  Tenderness palpation of the nail borders but no other areas of discomfort. No pain with calf compression, swelling, warmth, erythema  Assessment: 76 year old female ingrown toenails bilateral medial nail borders  Plan: -All treatment options discussed with the patient including all alternatives, risks, complications.  -At this time, the patient is requesting partial nail removal with chemical matricectomy to the symptomatic portion of the nail. Risks and complications were discussed with the patient for which they understand and written consent was obtained. Under sterile conditions a total of 3 mL of a mixture of 2% lidocaine plain and 0.5% Marcaine plain was infiltrated in a hallux block fashion. Once anesthetized, the skin was prepped in sterile fashion. A tourniquet was then applied. Next the medial aspect of hallux nail border was then sharply excised making sure to remove the entire offending nail border. Once the nails were ensured to be removed area was debrided and the underlying skin was intact. There is no purulence identified in the procedure. Next phenol was then applied under standard conditions and copiously irrigated. Silvadene was applied. A dry sterile dressing was applied. After application of the dressing the tourniquet was removed and there is found to be an immediate capillary refill time to the digit. The patient tolerated the procedure well any complications. Post procedure instructions were discussed the  patient for which he verbally understood. Follow-up in one week for nail check or sooner if any problems are to arise. Discussed signs/symptoms of infection and directed to call the office immediately should any occur or go directly to the emergency room. In the meantime, encouraged to call the office with any questions, concerns, changes symptoms. -Keflex -Patient encouraged to call the office with any questions, concerns, change in symptoms.   Trula Slade DPM

## 2022-02-08 ENCOUNTER — Ambulatory Visit: Payer: Medicare HMO | Admitting: Podiatry

## 2022-02-08 ENCOUNTER — Other Ambulatory Visit: Payer: Self-pay

## 2022-02-08 DIAGNOSIS — L600 Ingrowing nail: Secondary | ICD-10-CM

## 2022-02-11 NOTE — Progress Notes (Signed)
Subjective: ?76 year old female presents to the office today for follow-up evaluation after undergoing partial nail avulsions to bilateral hallux.  Left side is more sore than the right.  She did finish the course of antibiotics.  She has not seen any drainage or pus.  No increase in redness or any red streaks.  No fevers or chills.  No other concerns. ? ?Objective: ?AAO x3, NAD ?DP/PT pulses palpable bilaterally, CRT less than 3 seconds ?Status post partial nail avulsions.  Scab is formed.  There is minimal edema more in the left side than the right.  There is no drainage or pus.  No recent cellulitis.  No fluctuance or crepitation.  No signs of abscess today. ?No pain with calf compression, swelling, warmth, erythema ? ?Assessment: ?Status post partial nail avulsions bilateral hallux ? ?Plan: ?-All treatment options discussed with the patient including all alternatives, risks, complications.  ?-I would recommend continue soaking Epsom salts, the antibiotic ointment and a bandage in a day.  Can leave the area open at nighttime.  Monitor for any signs or symptoms of infection or reoccurrence. ?-Patient encouraged to call the office with any questions, concerns, change in symptoms.  ? ?Trula Slade DPM ? ?

## 2022-03-07 ENCOUNTER — Ambulatory Visit: Payer: Medicare HMO | Admitting: Podiatry

## 2022-03-23 DIAGNOSIS — M545 Low back pain, unspecified: Secondary | ICD-10-CM | POA: Diagnosis not present

## 2022-03-23 DIAGNOSIS — M25561 Pain in right knee: Secondary | ICD-10-CM | POA: Diagnosis not present

## 2022-04-20 DIAGNOSIS — M25561 Pain in right knee: Secondary | ICD-10-CM | POA: Diagnosis not present

## 2022-04-29 DIAGNOSIS — M25561 Pain in right knee: Secondary | ICD-10-CM | POA: Diagnosis not present

## 2022-05-04 DIAGNOSIS — M25561 Pain in right knee: Secondary | ICD-10-CM | POA: Diagnosis not present

## 2022-05-12 DIAGNOSIS — M1711 Unilateral primary osteoarthritis, right knee: Secondary | ICD-10-CM | POA: Diagnosis not present

## 2022-05-17 DIAGNOSIS — E7800 Pure hypercholesterolemia, unspecified: Secondary | ICD-10-CM | POA: Diagnosis not present

## 2022-05-17 DIAGNOSIS — G4700 Insomnia, unspecified: Secondary | ICD-10-CM | POA: Diagnosis not present

## 2022-05-17 DIAGNOSIS — R7303 Prediabetes: Secondary | ICD-10-CM | POA: Diagnosis not present

## 2022-05-17 DIAGNOSIS — Z0000 Encounter for general adult medical examination without abnormal findings: Secondary | ICD-10-CM | POA: Diagnosis not present

## 2022-05-17 DIAGNOSIS — F419 Anxiety disorder, unspecified: Secondary | ICD-10-CM | POA: Diagnosis not present

## 2022-06-01 ENCOUNTER — Telehealth: Payer: Self-pay | Admitting: Allergy

## 2022-06-01 NOTE — Telephone Encounter (Signed)
Courtesy refilled sent into the pharmacy, called patient and informed her.  [phone]

## 2022-06-01 NOTE — Telephone Encounter (Signed)
Sandra Hall called in and requested a refill for Atrovent.  I told Sandra Hall she hasn't been seen in over a year and that she needed an appointment.  Sandra Hall stated when she was seen she was told she didn't have any allergies and didn't think she needed to come back and nothing has changed.  Sandra Hall stated she would make an appointment if she really needed to but she would just like a refill sent to [facility]

## 2022-06-02 DIAGNOSIS — H40013 Open angle with borderline findings, low risk, bilateral: Secondary | ICD-10-CM | POA: Diagnosis not present

## 2022-06-03 MED ORDER — IPRATROPIUM BROMIDE 0.03 % NA SOLN: 2.0000 | Freq: Two times a day (BID) | NASAL | 0 refills | Status: AC | PRN

## 2022-06-03 NOTE — Telephone Encounter (Signed)
Patient called the office and states she called the pharmacy and they told her they didn't have a prescription for Atrovent. Patient would like this sent in as she is leaving out of town tomorrow.   [facility]

## 2022-06-03 NOTE — Telephone Encounter (Signed)
Called and informed patient that she would receive a courtesy refill as she has not been here since last year. Patient stated that Dr. Maudie Mercury expressed that she did not need to come in. According to her AVS it states to come back in three months if needed.  Please advise if patient needs to come in as she is in need of further refills. Thank you!

## 2022-06-03 NOTE — Addendum Note (Signed)
Addended by: Clovis Cao A on: 06/03/2022 12:19 PM   Modules accepted: Orders

## 2022-06-06 NOTE — Telephone Encounter (Signed)
Please call patient. She can have her PCP refill the nose spray if she doesn't want a follow up with [place]. I'm okay with that.   Usually we like to see a patient at least once per year if we do prescribe any type of medications.  Thank you.

## 2022-06-25 ENCOUNTER — Other Ambulatory Visit: Payer: Self-pay | Admitting: Allergy

## 2022-07-13 ENCOUNTER — Encounter: Payer: Self-pay | Admitting: Obstetrics & Gynecology

## 2022-07-13 ENCOUNTER — Ambulatory Visit (INDEPENDENT_AMBULATORY_CARE_PROVIDER_SITE_OTHER): Payer: Medicare HMO | Admitting: Obstetrics & Gynecology

## 2022-07-13 VITALS — BP 122/80 | Ht 64.0 in | Wt 125.0 lb

## 2022-07-13 DIAGNOSIS — Z9189 Other specified personal risk factors, not elsewhere classified: Secondary | ICD-10-CM | POA: Diagnosis not present

## 2022-07-13 DIAGNOSIS — Z01419 Encounter for gynecological examination (general) (routine) without abnormal findings: Secondary | ICD-10-CM

## 2022-07-13 DIAGNOSIS — Z780 Asymptomatic menopausal state: Secondary | ICD-10-CM

## 2022-07-13 DIAGNOSIS — Z90710 Acquired absence of both cervix and uterus: Secondary | ICD-10-CM

## 2022-07-13 DIAGNOSIS — M8580 Other specified disorders of bone density and structure, unspecified site: Secondary | ICD-10-CM

## 2022-07-13 NOTE — Progress Notes (Signed)
Sandra Hall 1946/03/29 [id number]   History:    76 y.o. G1P1L1 Married. Has 2 grand-children, boy 76 and girl 70 yo.   RP:  Established patient presenting for annual gyn exam   HPI: S/P TAH/RSO for Endometriosis.  Post-menopause, well on Estroven.  No Pelvic pain.  Abstinent.  Never had an abnormal Pap test. No indication to repeat a Pap at this time. Breasts normal. Mammo Neg 07/2021. Urine/BMs normal.  Colono 2019 Polyps.  Repeat Colono this year. BMI 21.46.  Walking, Tai-Chi, Gym and working in the yard. BD Osteopenia T-Score -2.2 in 07/2021. Health labs with Fam MD.         Past medical history,surgical history, family history and social history were all reviewed and documented in the EPIC chart.  Gynecologic History No LMP recorded. Patient has had a hysterectomy.  Obstetric History OB History  Gravida Para Term Preterm AB Living  1 1   1   1   SAB IAB Ectopic Multiple Live Births               # Outcome Date GA Lbr Len/2nd Weight Sex Delivery Anes PTL Lv  1 Preterm              ROS: A ROS was performed and pertinent positives and negatives are included in the history. GENERAL: No fevers or chills. HEENT: No change in vision, no earache, sore throat or sinus congestion. NECK: No pain or stiffness. CARDIOVASCULAR: No chest pain or pressure. No palpitations. PULMONARY: No shortness of breath, cough or wheeze. GASTROINTESTINAL: No abdominal pain, nausea, vomiting or diarrhea, melena or bright red blood per rectum. GENITOURINARY: No urinary frequency, urgency, hesitancy or dysuria. MUSCULOSKELETAL: No joint or muscle pain, no back pain, no recent trauma. DERMATOLOGIC: No rash, no itching, no lesions. ENDOCRINE: No polyuria, polydipsia, no heat or cold intolerance. No recent change in weight. HEMATOLOGICAL: No anemia or easy bruising or bleeding. NEUROLOGIC: No headache, seizures, numbness, tingling or weakness. PSYCHIATRIC: No depression, no loss of interest in normal activity or  change in sleep pattern.     Exam:   BP 122/80 (BP Location: Right Arm, Patient Position: Sitting, Cuff Size: Normal)   Ht 5' 4"  (1.626 m)   Wt 125 lb (56.7 kg)   BMI 21.46 kg/m   Body mass index is 21.46 kg/m.  General appearance : Well developed well nourished female. No acute distress HEENT: Eyes: no retinal hemorrhage or exudates,  Neck supple, trachea midline, no carotid bruits, no thyroidmegaly Lungs: Clear to auscultation, no rhonchi or wheezes, or rib retractions  Heart: Regular rate and rhythm, no murmurs or gallops Breast:Examined in sitting and supine position were symmetrical in appearance, no palpable masses or tenderness,  no skin retraction, no nipple inversion, no nipple discharge, no skin discoloration, no axillary or supraclavicular lymphadenopathy Abdomen: no palpable masses or tenderness, no rebound or guarding Extremities: no edema or skin discoloration or tenderness  Pelvic: Vulva: Normal             Vagina: No gross lesions or discharge  Cervix/Uterus absent  Adnexa  Without masses or tenderness  Anus: Normal   Assessment/Plan:  76 y.o. female for annual exam   1. Well female exam with routine gynecological exam S/P TAH/RSO for Endometriosis.  Post-menopause, well on Estroven.  No Pelvic pain.  Abstinent.  Never had an abnormal Pap test. No indication to repeat a Pap at this time. Breasts normal. Mammo Neg 07/2021. Urine/BMs normal.  Colono 2019  Polyps.  Repeat Colono this year. BMI 21.46.  Walking, Tai-Chi, Gym and working in the yard. BD Osteopenia T-Score -2.2 in 07/2021. Health labs with Fam MD.         2. S/P TAH (total abdominal hysterectomy) with RSO  3. Postmenopause S/P TAH/RSO for Endometriosis.  Post-menopause, well on Estroven.  No Pelvic pain.  Abstinent.  4. Osteopenia with increased Frax  BMI 21.46.  Walking, Tai-Chi, Gym and working in the yard. BD Osteopenia T-Score -2.2 in 07/2021.  Frax increased.  Decision not to start on Bone  Medication.  Vit D, Ca++ 1.5 g/d total and regular weight bearing activities.  Will repeat a BD in 07/2023.  Princess Bruins MD, 11:56 AM 07/13/2022

## 2022-07-29 DIAGNOSIS — M545 Low back pain, unspecified: Secondary | ICD-10-CM | POA: Diagnosis not present

## 2022-08-05 ENCOUNTER — Other Ambulatory Visit: Payer: Self-pay | Admitting: Gastroenterology

## 2022-08-05 DIAGNOSIS — R1310 Dysphagia, unspecified: Secondary | ICD-10-CM

## 2022-08-05 DIAGNOSIS — Z86010 Personal history of colonic polyps: Secondary | ICD-10-CM | POA: Diagnosis not present

## 2022-08-05 DIAGNOSIS — K219 Gastro-esophageal reflux disease without esophagitis: Secondary | ICD-10-CM | POA: Diagnosis not present

## 2022-08-09 ENCOUNTER — Ambulatory Visit
Admission: RE | Admit: 2022-08-09 | Discharge: 2022-08-09 | Disposition: A | Discharge status: Home / Self Care | Payer: Medicare HMO | Source: Ambulatory Visit | Attending: Gastroenterology | Admitting: Gastroenterology

## 2022-08-09 DIAGNOSIS — K224 Dyskinesia of esophagus: Secondary | ICD-10-CM | POA: Diagnosis not present

## 2022-08-09 DIAGNOSIS — R1310 Dysphagia, unspecified: Secondary | ICD-10-CM | POA: Diagnosis not present

## 2022-08-09 DIAGNOSIS — K225 Diverticulum of esophagus, acquired: Secondary | ICD-10-CM | POA: Diagnosis not present

## 2022-08-10 DIAGNOSIS — M6281 Muscle weakness (generalized): Secondary | ICD-10-CM | POA: Diagnosis not present

## 2022-08-10 DIAGNOSIS — M5416 Radiculopathy, lumbar region: Secondary | ICD-10-CM | POA: Diagnosis not present

## 2022-08-11 DIAGNOSIS — H04123 Dry eye syndrome of bilateral lacrimal glands: Secondary | ICD-10-CM | POA: Diagnosis not present

## 2022-08-11 DIAGNOSIS — H26493 Other secondary cataract, bilateral: Secondary | ICD-10-CM | POA: Diagnosis not present

## 2022-08-11 DIAGNOSIS — H40013 Open angle with borderline findings, low risk, bilateral: Secondary | ICD-10-CM | POA: Diagnosis not present

## 2022-08-11 DIAGNOSIS — H04222 Epiphora due to insufficient drainage, left lacrimal gland: Secondary | ICD-10-CM | POA: Diagnosis not present

## 2022-08-23 DIAGNOSIS — Z1231 Encounter for screening mammogram for malignant neoplasm of breast: Secondary | ICD-10-CM | POA: Diagnosis not present

## 2022-08-26 DIAGNOSIS — M5416 Radiculopathy, lumbar region: Secondary | ICD-10-CM | POA: Diagnosis not present

## 2022-09-23 ENCOUNTER — Other Ambulatory Visit: Payer: Self-pay | Admitting: Allergy

## 2022-10-26 DIAGNOSIS — K635 Polyp of colon: Secondary | ICD-10-CM | POA: Diagnosis not present

## 2022-10-26 DIAGNOSIS — Z86010 Personal history of colonic polyps: Secondary | ICD-10-CM | POA: Diagnosis not present

## 2022-10-26 DIAGNOSIS — Z09 Encounter for follow-up examination after completed treatment for conditions other than malignant neoplasm: Secondary | ICD-10-CM | POA: Diagnosis not present

## 2022-10-26 DIAGNOSIS — K649 Unspecified hemorrhoids: Secondary | ICD-10-CM | POA: Diagnosis not present

## 2022-10-28 DIAGNOSIS — K635 Polyp of colon: Secondary | ICD-10-CM | POA: Diagnosis not present

## 2022-11-29 DIAGNOSIS — M5137 Other intervertebral disc degeneration, lumbosacral region: Secondary | ICD-10-CM | POA: Diagnosis not present

## 2022-11-30 DIAGNOSIS — M5416 Radiculopathy, lumbar region: Secondary | ICD-10-CM | POA: Diagnosis not present

## 2022-11-30 DIAGNOSIS — M25552 Pain in left hip: Secondary | ICD-10-CM | POA: Diagnosis not present

## 2022-11-30 DIAGNOSIS — M25561 Pain in right knee: Secondary | ICD-10-CM | POA: Diagnosis not present

## 2022-12-01 ENCOUNTER — Other Ambulatory Visit: Payer: Self-pay | Admitting: Neurosurgery

## 2022-12-01 DIAGNOSIS — M5137 Other intervertebral disc degeneration, lumbosacral region: Secondary | ICD-10-CM

## 2022-12-07 DIAGNOSIS — M25561 Pain in right knee: Secondary | ICD-10-CM | POA: Diagnosis not present

## 2022-12-07 DIAGNOSIS — M5416 Radiculopathy, lumbar region: Secondary | ICD-10-CM | POA: Diagnosis not present

## 2022-12-07 DIAGNOSIS — M25552 Pain in left hip: Secondary | ICD-10-CM | POA: Diagnosis not present

## 2022-12-09 DIAGNOSIS — M25552 Pain in left hip: Secondary | ICD-10-CM | POA: Diagnosis not present

## 2022-12-09 DIAGNOSIS — M25561 Pain in right knee: Secondary | ICD-10-CM | POA: Diagnosis not present

## 2022-12-09 DIAGNOSIS — M5416 Radiculopathy, lumbar region: Secondary | ICD-10-CM | POA: Diagnosis not present

## 2022-12-14 DIAGNOSIS — M5416 Radiculopathy, lumbar region: Secondary | ICD-10-CM | POA: Diagnosis not present

## 2022-12-14 DIAGNOSIS — M25561 Pain in right knee: Secondary | ICD-10-CM | POA: Diagnosis not present

## 2022-12-14 DIAGNOSIS — M25552 Pain in left hip: Secondary | ICD-10-CM | POA: Diagnosis not present

## 2022-12-15 DIAGNOSIS — M25561 Pain in right knee: Secondary | ICD-10-CM | POA: Diagnosis not present

## 2022-12-15 DIAGNOSIS — M25552 Pain in left hip: Secondary | ICD-10-CM | POA: Diagnosis not present

## 2022-12-15 DIAGNOSIS — M5416 Radiculopathy, lumbar region: Secondary | ICD-10-CM | POA: Diagnosis not present

## 2022-12-19 DIAGNOSIS — M5416 Radiculopathy, lumbar region: Secondary | ICD-10-CM | POA: Diagnosis not present

## 2022-12-19 DIAGNOSIS — M25552 Pain in left hip: Secondary | ICD-10-CM | POA: Diagnosis not present

## 2022-12-19 DIAGNOSIS — M25561 Pain in right knee: Secondary | ICD-10-CM | POA: Diagnosis not present

## 2022-12-27 ENCOUNTER — Ambulatory Visit
Admission: RE | Admit: 2022-12-27 | Discharge: 2022-12-27 | Disposition: A | Discharge status: Home / Self Care | Payer: Medicare HMO | Source: Ambulatory Visit | Attending: Neurosurgery | Admitting: Neurosurgery

## 2022-12-27 DIAGNOSIS — M47816 Spondylosis without myelopathy or radiculopathy, lumbar region: Secondary | ICD-10-CM | POA: Diagnosis not present

## 2022-12-27 DIAGNOSIS — M5126 Other intervertebral disc displacement, lumbar region: Secondary | ICD-10-CM | POA: Diagnosis not present

## 2022-12-27 DIAGNOSIS — M5136 Other intervertebral disc degeneration, lumbar region: Secondary | ICD-10-CM | POA: Diagnosis not present

## 2022-12-27 DIAGNOSIS — M5137 Other intervertebral disc degeneration, lumbosacral region: Secondary | ICD-10-CM

## 2022-12-27 MED ORDER — GADOPICLENOL 0.5 MMOL/ML IV SOLN
6.0000 mL | Freq: Once | INTRAVENOUS | Status: AC | PRN
  Administered 2022-12-27: 6 mL via INTRAVENOUS

## 2022-12-28 DIAGNOSIS — M5416 Radiculopathy, lumbar region: Secondary | ICD-10-CM | POA: Diagnosis not present

## 2022-12-28 DIAGNOSIS — M25552 Pain in left hip: Secondary | ICD-10-CM | POA: Diagnosis not present

## 2022-12-28 DIAGNOSIS — M25561 Pain in right knee: Secondary | ICD-10-CM | POA: Diagnosis not present

## 2022-12-30 DIAGNOSIS — M5416 Radiculopathy, lumbar region: Secondary | ICD-10-CM | POA: Diagnosis not present

## 2022-12-30 DIAGNOSIS — M25561 Pain in right knee: Secondary | ICD-10-CM | POA: Diagnosis not present

## 2022-12-30 DIAGNOSIS — M25552 Pain in left hip: Secondary | ICD-10-CM | POA: Diagnosis not present

## 2023-01-04 DIAGNOSIS — M5416 Radiculopathy, lumbar region: Secondary | ICD-10-CM | POA: Diagnosis not present

## 2023-01-04 DIAGNOSIS — M25561 Pain in right knee: Secondary | ICD-10-CM | POA: Diagnosis not present

## 2023-01-04 DIAGNOSIS — M25552 Pain in left hip: Secondary | ICD-10-CM | POA: Diagnosis not present

## 2023-01-12 DIAGNOSIS — M5416 Radiculopathy, lumbar region: Secondary | ICD-10-CM | POA: Diagnosis not present

## 2023-01-12 DIAGNOSIS — M25552 Pain in left hip: Secondary | ICD-10-CM | POA: Diagnosis not present

## 2023-01-12 DIAGNOSIS — M25561 Pain in right knee: Secondary | ICD-10-CM | POA: Diagnosis not present

## 2023-01-17 DIAGNOSIS — M25561 Pain in right knee: Secondary | ICD-10-CM | POA: Diagnosis not present

## 2023-01-17 DIAGNOSIS — M25552 Pain in left hip: Secondary | ICD-10-CM | POA: Diagnosis not present

## 2023-01-17 DIAGNOSIS — M5416 Radiculopathy, lumbar region: Secondary | ICD-10-CM | POA: Diagnosis not present

## 2023-01-19 DIAGNOSIS — M5137 Other intervertebral disc degeneration, lumbosacral region: Secondary | ICD-10-CM | POA: Diagnosis not present

## 2023-01-30 DIAGNOSIS — M25552 Pain in left hip: Secondary | ICD-10-CM | POA: Diagnosis not present

## 2023-01-30 DIAGNOSIS — M25561 Pain in right knee: Secondary | ICD-10-CM | POA: Diagnosis not present

## 2023-01-30 DIAGNOSIS — M5416 Radiculopathy, lumbar region: Secondary | ICD-10-CM | POA: Diagnosis not present

## 2023-02-13 DIAGNOSIS — M5416 Radiculopathy, lumbar region: Secondary | ICD-10-CM | POA: Diagnosis not present

## 2023-02-13 DIAGNOSIS — M25561 Pain in right knee: Secondary | ICD-10-CM | POA: Diagnosis not present

## 2023-02-13 DIAGNOSIS — M25552 Pain in left hip: Secondary | ICD-10-CM | POA: Diagnosis not present

## 2023-03-24 DIAGNOSIS — M546 Pain in thoracic spine: Secondary | ICD-10-CM | POA: Diagnosis not present

## 2023-05-22 DIAGNOSIS — M8580 Other specified disorders of bone density and structure, unspecified site: Secondary | ICD-10-CM | POA: Diagnosis not present

## 2023-05-22 DIAGNOSIS — E7800 Pure hypercholesterolemia, unspecified: Secondary | ICD-10-CM | POA: Diagnosis not present

## 2023-05-22 DIAGNOSIS — Z0000 Encounter for general adult medical examination without abnormal findings: Secondary | ICD-10-CM | POA: Diagnosis not present

## 2023-05-22 DIAGNOSIS — G4700 Insomnia, unspecified: Secondary | ICD-10-CM | POA: Diagnosis not present

## 2023-05-22 DIAGNOSIS — F419 Anxiety disorder, unspecified: Secondary | ICD-10-CM | POA: Diagnosis not present

## 2023-05-22 DIAGNOSIS — R5383 Other fatigue: Secondary | ICD-10-CM | POA: Diagnosis not present

## 2023-05-22 DIAGNOSIS — R7303 Prediabetes: Secondary | ICD-10-CM | POA: Diagnosis not present

## 2023-05-31 DIAGNOSIS — H04123 Dry eye syndrome of bilateral lacrimal glands: Secondary | ICD-10-CM | POA: Diagnosis not present

## 2023-05-31 DIAGNOSIS — H40013 Open angle with borderline findings, low risk, bilateral: Secondary | ICD-10-CM | POA: Diagnosis not present

## 2023-05-31 DIAGNOSIS — H16223 Keratoconjunctivitis sicca, not specified as Sjogren's, bilateral: Secondary | ICD-10-CM | POA: Diagnosis not present

## 2023-07-18 ENCOUNTER — Ambulatory Visit: Payer: Medicare HMO | Admitting: Obstetrics & Gynecology

## 2023-08-14 DIAGNOSIS — L718 Other rosacea: Secondary | ICD-10-CM | POA: Diagnosis not present

## 2023-08-14 DIAGNOSIS — R208 Other disturbances of skin sensation: Secondary | ICD-10-CM | POA: Diagnosis not present

## 2023-08-16 DIAGNOSIS — H16223 Keratoconjunctivitis sicca, not specified as Sjogren's, bilateral: Secondary | ICD-10-CM | POA: Diagnosis not present

## 2023-08-16 DIAGNOSIS — H1045 Other chronic allergic conjunctivitis: Secondary | ICD-10-CM | POA: Diagnosis not present

## 2023-08-16 DIAGNOSIS — H40013 Open angle with borderline findings, low risk, bilateral: Secondary | ICD-10-CM | POA: Diagnosis not present

## 2023-08-16 DIAGNOSIS — H04123 Dry eye syndrome of bilateral lacrimal glands: Secondary | ICD-10-CM | POA: Diagnosis not present

## 2023-08-17 ENCOUNTER — Ambulatory Visit (INDEPENDENT_AMBULATORY_CARE_PROVIDER_SITE_OTHER): Payer: Medicare HMO | Admitting: Radiology

## 2023-08-17 ENCOUNTER — Encounter: Payer: Self-pay | Admitting: Radiology

## 2023-08-17 VITALS — BP 124/68 | Ht 63.5 in | Wt 123.0 lb

## 2023-08-17 DIAGNOSIS — Z01419 Encounter for gynecological examination (general) (routine) without abnormal findings: Secondary | ICD-10-CM

## 2023-08-17 DIAGNOSIS — Z9189 Other specified personal risk factors, not elsewhere classified: Secondary | ICD-10-CM | POA: Diagnosis not present

## 2023-08-17 NOTE — Progress Notes (Signed)
Sandra Hall 1946/10/21 [id number]   History:  77 y.o. G1P1 presents for annual exam. Has some slight stress incontinence otherwise doing well.  Gynecologic History Hysterectomy: TAH/RSO  Sexually active: yes  Health Maintenance Last Pap: 2012 normal Last mammogram: 2023. Results were: normal, scheduled 08/29/23 Last colonoscopy: 2019 Dr Ewing Schlein Last Dexa: 2023  Past medical history, past surgical history, family history and social history were all reviewed and documented in the EPIC chart.  ROS:  A ROS was performed and pertinent positives and negatives are included.  Exam:  Vitals:   08/17/23 1132  BP: 124/68  Weight: 123 lb (55.8 kg)  Height: 5' 3.5" (1.613 m)   Body mass index is 21.45 kg/m.  General appearance:  Normal Abdominal  Soft,nontender, without masses, guarding or rebound.  Liver/spleen:  No organomegaly noted  Hernia:  None appreciated  Skin  Inspection:  Grossly normal Breasts: Examined lying and sitting.   Right: Without masses, retractions, nipple discharge or axillary adenopathy.   Left: Without masses, retractions, nipple discharge or axillary adenopathy. Genitourinary   Inguinal/mons:  Normal without inguinal adenopathy  External genitalia:  Normal appearing vulva with no masses, tenderness, or lesions  BUS/Urethra/Skene's glands:  Normal  Vagina:  Normal appearing with normal color and discharge, no lesions. Atrophy mild  Cervix:  absent  Uterus:  absent  Adnexa/parametria:     Rt: Normal in size, without masses or tenderness.   Lt: Unable to palpate  Anus and perineum: Normal   Raynelle Fanning, CMA present for exam  Assessment/Plan:   1. Encounter for breast and pelvic examination May d/c pap smears Mammogram yearly Return 1 year    Tanda Rockers WHNP-BC 11:48 AM 08/17/2023

## 2023-08-29 DIAGNOSIS — Z1231 Encounter for screening mammogram for malignant neoplasm of breast: Secondary | ICD-10-CM | POA: Diagnosis not present

## 2024-02-08 DIAGNOSIS — R1110 Vomiting, unspecified: Secondary | ICD-10-CM | POA: Diagnosis not present

## 2024-05-13 DIAGNOSIS — Z6821 Body mass index (BMI) 21.0-21.9, adult: Secondary | ICD-10-CM | POA: Diagnosis not present

## 2024-05-13 DIAGNOSIS — L308 Other specified dermatitis: Secondary | ICD-10-CM | POA: Diagnosis not present

## 2024-05-23 DIAGNOSIS — Z0000 Encounter for general adult medical examination without abnormal findings: Secondary | ICD-10-CM | POA: Diagnosis not present

## 2024-05-23 DIAGNOSIS — F322 Major depressive disorder, single episode, severe without psychotic features: Secondary | ICD-10-CM | POA: Diagnosis not present

## 2024-05-23 DIAGNOSIS — F419 Anxiety disorder, unspecified: Secondary | ICD-10-CM | POA: Diagnosis not present

## 2024-05-23 DIAGNOSIS — E7800 Pure hypercholesterolemia, unspecified: Secondary | ICD-10-CM | POA: Diagnosis not present

## 2024-05-23 DIAGNOSIS — M8580 Other specified disorders of bone density and structure, unspecified site: Secondary | ICD-10-CM | POA: Diagnosis not present

## 2024-05-23 DIAGNOSIS — G4700 Insomnia, unspecified: Secondary | ICD-10-CM | POA: Diagnosis not present

## 2024-05-23 DIAGNOSIS — Z6821 Body mass index (BMI) 21.0-21.9, adult: Secondary | ICD-10-CM | POA: Diagnosis not present

## 2024-05-23 DIAGNOSIS — R7303 Prediabetes: Secondary | ICD-10-CM | POA: Diagnosis not present

## 2024-05-23 DIAGNOSIS — Z1331 Encounter for screening for depression: Secondary | ICD-10-CM | POA: Diagnosis not present

## 2024-07-09 DIAGNOSIS — L814 Other melanin hyperpigmentation: Secondary | ICD-10-CM | POA: Diagnosis not present

## 2024-07-09 DIAGNOSIS — L821 Other seborrheic keratosis: Secondary | ICD-10-CM | POA: Diagnosis not present

## 2024-07-09 DIAGNOSIS — D485 Neoplasm of uncertain behavior of skin: Secondary | ICD-10-CM | POA: Diagnosis not present

## 2024-07-09 DIAGNOSIS — L718 Other rosacea: Secondary | ICD-10-CM | POA: Diagnosis not present

## 2024-07-09 DIAGNOSIS — L820 Inflamed seborrheic keratosis: Secondary | ICD-10-CM | POA: Diagnosis not present

## 2024-07-09 DIAGNOSIS — L84 Corns and callosities: Secondary | ICD-10-CM | POA: Diagnosis not present

## 2024-08-15 DIAGNOSIS — H04123 Dry eye syndrome of bilateral lacrimal glands: Secondary | ICD-10-CM | POA: Diagnosis not present

## 2024-08-15 DIAGNOSIS — H524 Presbyopia: Secondary | ICD-10-CM | POA: Diagnosis not present

## 2024-08-15 DIAGNOSIS — H16223 Keratoconjunctivitis sicca, not specified as Sjogren's, bilateral: Secondary | ICD-10-CM | POA: Diagnosis not present

## 2024-08-15 DIAGNOSIS — H40013 Open angle with borderline findings, low risk, bilateral: Secondary | ICD-10-CM | POA: Diagnosis not present

## 2024-08-19 DIAGNOSIS — Z6822 Body mass index (BMI) 22.0-22.9, adult: Secondary | ICD-10-CM | POA: Diagnosis not present

## 2024-08-19 DIAGNOSIS — R0781 Pleurodynia: Secondary | ICD-10-CM | POA: Diagnosis not present

## 2024-08-19 DIAGNOSIS — M79601 Pain in right arm: Secondary | ICD-10-CM | POA: Diagnosis not present

## 2024-08-19 DIAGNOSIS — W108XXA Fall (on) (from) other stairs and steps, initial encounter: Secondary | ICD-10-CM | POA: Diagnosis not present

## 2024-08-19 DIAGNOSIS — M79602 Pain in left arm: Secondary | ICD-10-CM | POA: Diagnosis not present

## 2024-08-21 ENCOUNTER — Encounter: Payer: Medicare HMO | Admitting: Radiology

## 2024-09-03 DIAGNOSIS — Z1231 Encounter for screening mammogram for malignant neoplasm of breast: Secondary | ICD-10-CM | POA: Diagnosis not present

## 2024-10-16 ENCOUNTER — Encounter: Admitting: Radiology

## 2024-11-14 ENCOUNTER — Ambulatory Visit: Admitting: Radiology

## 2024-11-14 VITALS — BP 124/62 | Ht 63.5 in | Wt 125.0 lb

## 2024-11-14 DIAGNOSIS — Z780 Asymptomatic menopausal state: Secondary | ICD-10-CM

## 2024-11-14 DIAGNOSIS — M8580 Other specified disorders of bone density and structure, unspecified site: Secondary | ICD-10-CM

## 2024-11-14 DIAGNOSIS — Z9189 Other specified personal risk factors, not elsewhere classified: Secondary | ICD-10-CM

## 2024-11-14 DIAGNOSIS — Z01419 Encounter for gynecological examination (general) (routine) without abnormal findings: Secondary | ICD-10-CM

## 2024-11-14 NOTE — Progress Notes (Signed)
° °  Monesha Monreal Ertle December 22, 1945 [id number]   History:  78 y.o. G1P1 presents for annual exam. Husband was diagnosed with stage 2 pancreatic cancer this year. Starts chemo soon.   Risk Factors for Medicare Patients >/= 5 sexual partners in a lifetime: Yes First intercourse <5 years of age: No H/O STD at any age: No Abnormal pap smear, < 3 negative paps within the last 7 years: No DES exposure (women born between [phone]): No Patient is on post breast cancer medication like Femara or, if medication like this is not needed, 5 years post breast cancer diagnosis: No   Gynecologic History Hysterectomy: TAH/RSO  Sexually active: yes  Health Maintenance Last Pap: 2012 normal Last mammogram: 08/2024. Results were: normal, scheduled 08/29/23 Last colonoscopy: 2019 Dr Rosalie Last Dexa: 2022 osteopenia  Past medical history, past surgical history, family history and social history were all reviewed and documented in the EPIC chart.  ROS:  A ROS was performed and pertinent positives and negatives are included.  Exam:  Vitals:   11/14/24 1155  BP: 124/62  Weight: 125 lb (56.7 kg)  Height: 5' 3.5 (1.613 m)   Body mass index is 21.8 kg/m.  General appearance:  Normal Abdominal  Soft,nontender, without masses, guarding or rebound.  Liver/spleen:  No organomegaly noted  Hernia:  None appreciated  Skin  Inspection:  Grossly normal Breasts: Examined lying and sitting.   Right: Without masses, retractions, nipple discharge or axillary adenopathy.   Left: Without masses, retractions, nipple discharge or axillary adenopathy. Genitourinary   Inguinal/mons:  Normal without inguinal adenopathy  External genitalia:  Normal appearing vulva with no masses, tenderness, or lesions  BUS/Urethra/Skene's glands:  Normal  Vagina:  Normal appearing with normal color and discharge, no lesions. Atrophy mild  Cervix:  absent  Uterus:  absent  Adnexa/parametria:     Rt: Normal in size, without masses or  tenderness.   Lt: Unable to palpate  Anus and perineum: Normal   Darice Hoit, CMA present for exam  Assessment/Plan:   1. Encounter for breast and pelvic examination May d/c pap smears Mammogram yearly  2. Osteopenia after menopause - DG Bone Density; Future    Return 1 year HR B&P    Makenley Shimp B WHNP-BC 12:01 PM 11/14/2024

## 2024-12-18 ENCOUNTER — Ambulatory Visit (HOSPITAL_BASED_OUTPATIENT_CLINIC_OR_DEPARTMENT_OTHER)
Admission: RE | Admit: 2024-12-18 | Discharge: 2024-12-18 | Disposition: A | Discharge status: Home / Self Care | Source: Ambulatory Visit | Attending: Radiology | Admitting: Radiology

## 2024-12-18 DIAGNOSIS — M8580 Other specified disorders of bone density and structure, unspecified site: Secondary | ICD-10-CM | POA: Insufficient documentation

## 2024-12-18 DIAGNOSIS — Z780 Asymptomatic menopausal state: Secondary | ICD-10-CM | POA: Diagnosis present

## 2024-12-19 ENCOUNTER — Ambulatory Visit: Payer: Self-pay | Admitting: Radiology
